# Patient Record
Sex: Male | Born: 1940 | Race: White | Hispanic: No | Marital: Married | Smoking: Never smoker
Health system: Southern US, Community
[De-identification: ages and names within clinical notes are randomized; demographics above are authoritative.]

## PROBLEM LIST (undated history)

## (undated) DIAGNOSIS — C8590 Non-Hodgkin lymphoma, unspecified, unspecified site: Secondary | ICD-10-CM

## (undated) DIAGNOSIS — N400 Enlarged prostate without lower urinary tract symptoms: Secondary | ICD-10-CM

## (undated) DIAGNOSIS — N401 Enlarged prostate with lower urinary tract symptoms: Secondary | ICD-10-CM

## (undated) DIAGNOSIS — N138 Other obstructive and reflux uropathy: Secondary | ICD-10-CM

## (undated) DIAGNOSIS — C8588 Other specified types of non-Hodgkin lymphoma, lymph nodes of multiple sites: Secondary | ICD-10-CM

## (undated) HISTORY — DX: Benign prostatic hyperplasia without lower urinary tract symptoms: N40.0

## (undated) HISTORY — PX: HERNIA REPAIR: SHX51

## (undated) HISTORY — DX: Non-Hodgkin lymphoma, unspecified, unspecified site: C85.90

---

## 2008-07-23 LAB — CBC WITH AUTOMATED DIFF
ABS. EOSINOPHILS: 0 10*3/uL (ref 0.0–0.4)
ABS. LYMPHOCYTES: 0.6 10*3/uL — ABNORMAL LOW (ref 0.8–3.5)
ABS. MONOCYTES: 0.8 10*3/uL (ref 0–1.0)
ABS. NEUTROPHILS: 5.7 10*3/uL (ref 1.8–8.0)
BASOPHILS: 0 % (ref 0–3)
EOSINOPHILS: 0 % (ref 0–5)
HCT: 29.1 % — ABNORMAL LOW (ref 37.0–49.0)
HGB: 9.1 g/dL — ABNORMAL LOW (ref 13.0–16.0)
LYMPHOCYTES: 9 % — ABNORMAL LOW (ref 20–51)
MCH: 23.2 PG — ABNORMAL LOW (ref 25.0–35.0)
MCHC: 31.3 g/dL (ref 31.0–37.0)
MCV: 74.2 FL — ABNORMAL LOW (ref 78.0–98.0)
MONOCYTES: 11 % — ABNORMAL HIGH (ref 2–9)
MPV: 6 FL — ABNORMAL LOW (ref 7.4–10.4)
NEUTROPHILS: 80 % — ABNORMAL HIGH (ref 42–75)
PLATELET: 384 10*3/uL (ref 130–400)
RBC: 3.92 M/uL — ABNORMAL LOW (ref 4.50–5.30)
RDW: 20.7 % — ABNORMAL HIGH (ref 11.5–14.5)
WBC: 7.1 10*3/uL (ref 4.5–13.0)

## 2008-07-24 LAB — GLUCOSE, RANDOM: Glucose: 113 MG/DL — ABNORMAL HIGH (ref 74–99)

## 2008-07-24 LAB — VITAMIN B12: Vitamin B12: 260 pg/mL (ref 211–911)

## 2008-07-24 LAB — HEMOGLOBIN A1C WITH EAG: Hemoglobin A1c: 5.9 % (ref 4.8–6.0)

## 2008-07-25 LAB — C-PEPTIDE
C-Peptide: 1.9 ng/mL (ref 0.8–3.5)
C-Peptide: 1.9 ng/mL (ref 0.8–3.5)

## 2008-07-27 LAB — IRON PROFILE
Iron % saturation: 7 % — ABNORMAL LOW (ref 20–50)
Iron: 12 ug/dL — ABNORMAL LOW (ref 35–150)
TIBC: 168 ug/dL — ABNORMAL LOW (ref 250–450)

## 2008-08-11 DIAGNOSIS — C8290 Follicular lymphoma, unspecified, unspecified site: Secondary | ICD-10-CM | POA: Insufficient documentation

## 2009-03-23 DIAGNOSIS — E041 Nontoxic single thyroid nodule: Secondary | ICD-10-CM | POA: Insufficient documentation

## 2009-03-23 DIAGNOSIS — E559 Vitamin D deficiency, unspecified: Secondary | ICD-10-CM | POA: Insufficient documentation

## 2009-03-23 DIAGNOSIS — G5793 Unspecified mononeuropathy of bilateral lower limbs: Secondary | ICD-10-CM | POA: Insufficient documentation

## 2009-03-23 NOTE — H&P (Signed)
[facility]                              HISTORY AND PHYSICAL                              JUDI Army Melia, M.D.   NAMEBetsey Mcdaniel, [name]              SEX:                M   ADMIT:  03/23/2009                 DOB:                [birth year]   MR#     [id number]                   ROOM:               [id number]   ACCT#   [id number]   cc:    [name], M.D.   PRIMARY CARE PHYSICIAN   None   CHIEF COMPLAINT   Weight loss.   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  The patient states that his symptoms   initiated over 40 years ago while he was in [place].  He believes that he   was infected with a parasite.  He has had indigestion and abdominal   discomfort over the years.  One year ago the patient lost 10 pounds   purposely but experienced some abdominal discomfort and was referred to   radiology to have a CT of the abdomen and pelvis.  That study showed   splenomegaly with significant retroperitoneal adenopathy suspicious for   lymphoma.  There was also a large cyst within the left kidney measuring 10   cm with two smaller cysts.  He also had a fat density measuring 3.2 cm   posterior to the splenic vein and inferior to the pancreas of uncertain   etiology.  The patient was aware of this abnormality but opted not to   pursue Western medicine but to pursue traditional [place] medicine.  He was   seen by a [place] medicine doctor by the name of Luevenia Maxin.  This   person initiated acupuncture cupping and various other techniques which   also included the ingestion of certain teas.  The patient states that his   symptoms were abating, and he felt that he was clinically improving until   about 2 weeks ago.  For reasons he was unable to do divulge to me, he left   her care.  He states at that same time he quit his job and reported that he    retired and has been at home essentially depressed per the patient and his   wife.  He has continued to lose approximately 40 pounds over the last year   and now he presents to the emergency department because of persistent   fatigue and upper and lower extremity swelling.  He has significant scrotal   edema.  He has had some pain in his lower extremities.  He also reports   orthopnea.  Upon arrival to the ED, blood pressure was 142/80, pulse 86 and   respirations  were 16.  He had a low grade fever of 99.1.  O2 saturation was   98% on room air.  The patient was complaining of pain.  Chest x-ray showed   the heart was tortuous.  There was a moderate right pleural effusion left   and a smaller left pleural effusion.  There was also some atelectasis of   the bilateral lower lobes.  A CT of the abdomen showed diffuse   retroperitoneal and mesenteric lymphadenopathy which has progressed since   the previous study of November 2009.  His main consideration would be   lymphoma.  He also has a moderate right pleural effusion as reported   earlier.  He has ascites, diffuse anasarca and mesenteric edema, bilateral   renal cysts, a moderate pericardial effusion as well as patchy sclerotic   foci involving T11 and L3 vertebral bodies, questionable metastatic foci.   The patient's pain was controlled.  His laboratory data was significantly   abnormal as well.  He had hypokalemia and significant anemia with a   hemoglobin and hematocrit of 5 and 17.  The patient was typed and   crossmatched and transfused 2 units of packed red blood cells.  He was   admitted for further evaluation and treatment.   PAST MEDICAL HISTORY   Past medical history is significant for benign prostatic hypertrophy.   ALLERGIES   No known drug allergies.   MEDICATIONS   He takes no medication.   SOCIAL HISTORY   He denies any tobacco, alcohol or illicit drug use.  He is on his second   marriage has two children from a prior marriage.  He is a retired    Sport and exercise psychologist.   PAST SURGICAL HISTORY   Surgical history includes bilateral herniorrhaphies.   REVIEW OF SYSTEMS   As per HPI.  The patient states that he generally does not go to doctors.   He is aware that his symptoms a year ago may have been secondary to   lymphoma but wanted to try alternative medication.  His purpose for   admission is to control his symptoms and to obtain a firm diagnosis.  He is   not sure if he wants to pursue Western medicine, i.e. chemoradiation   therapy.  He reports that he often has headaches.  He denies any blurred   vision.  He has had some shortness of breath.  He reports orthopnea and   PND.  He has not had any chest pain.  He has abdominal discomfort and   suffers from diarrhea and constipation.  He has lower extremity swelling.   He does not have dysuria, urgency or frequency, but the edema in his   extremities has ascended to his penis and scrotum.  He reports some   neuropathy.  He states he also suffers from anxiety disorder and insomnia.   His comprehensive review of systems is negative.   PHYSICAL EXAMINATION   VITAL SIGNS:  His blood pressure is 151/72, pulse 97, respirations 18, and   he is afebrile.   GENERAL:  He is ill in appearance.  He is very, very pale.  He is awake,   alert and oriented x3.   HEENT:  Pupils are equal, round and reactive to light and accommodation.   Extraocular muscles are intact.  He has pale conjunctivae.  He wears   corrective lenses.   NECK:  His neck is supple.  No lymphadenopathy appreciated.  He has a   protuberant Adam's apple.  LUNGS:  He has decreased breath sounds bilaterally.   CARDIOVASCULAR:  Regular rate and rhythm.  No murmurs, clicks or rubs   appreciated.   ABDOMEN:  The abdomen is distended and tender.  No rebound tenderness or   guarding.  He does have a little bit of an umbilical hernia that is   reducible.   EXTREMITIES:  2 to 3+ edema of the bilateral lower extremities.  Upper    extremity edema up into the elbow.   He does have scrotal edema.  Positive   peripheral pulses.   LYMPHATICS:  There is palpable lymphadenopathy in bilateral axillae,   approximately 1-2 cm each, and both are mobile.   LABORATORY DATA   Sodium 137, potassium 3, chloride 97, bicarbonate 34, BUN 11, creatinine   0.5, glucose 128, AST 13, ALT 9, alkaline phosphatase 104, total bilirubin   0.9, calcium 8, albumin 1.8 and lipase is 67.  The CBC shows a white blood   cell count of 4.4, hemoglobin/hematocrit 5.4 and 17.1, MCV 75.6, RDW 22.8,   platelets 143 and 75% segs.  Urinalysis is essentially clear.  Imaging   studies as reported earlier.   IMPRESSION   1. Significant abdominal lymphadenopathy with probable lymphoma diagnosis.   2. 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   3. Severe microcytic anemia.   4. Hypokalemia.   5. Protein calorie malnutrition.   6. Benign prostatic hypertrophy.   PLAN   Will pursue following the diagnosis of the lymphadenopathy and obtain   tissue diagnosis to confirm this.  Consulting surgeon Dr. Tresa Res to assist   with this.  In the interim, will transfuse 2 units of packed red blood   cells.  Will provide diuretic therapy.  Will also obtain PVLs of the   bilateral lower extremities to rule out DVT.  I have also ordered a CT of   the head as the patient's wife reports an episode of mental confusion,   following this with a CT of the chest to follow up the possibility of   metastases to the chest.  Will also obtain a 2-D echocardiogram to rule out   systolic versus diastolic heart failure.  The patient does have bilateral   pleural effusions.  Will also check a variative of labs, iron studies and   stool occult blood, although the patient denies any melena, hematochezia or   hemoptysis.  Will check a PSA level as well as thyroid function studies.   As the patient does have significant abdominal ascites, will obtain a    paracentesis and send ascitic fluid for culture, cytology, albumin, etc.   Deep venous thrombosis and ulcer prophylaxes have also been implemented.   The patient is requesting a do not resuscitate/do not intubate code status.   Thank you for this opportunity to assist in the patient's management.   __________________________   [name], M.D.   Dictated By:   lo  D:  03/23/2009  T:  03/24/2009 12:43 P   [id number]

## 2009-03-23 NOTE — ED Provider Notes (Signed)
ADMISSION                           [facility]                      EMERGENCY DEPARTMENT TREATMENT REPORT   NAME:  [name], [name]                  SEX:            M   DATE:  03/23/2009                     DOB:            [birth year]   MR#    [id number]                       TIME SEEN       11:34 A   ACCT#  [id number]                      ROOM:           [id number]   cc:   TIME OF MY EVALUATION:  10:04.   CHIEF COMPLAINT:  Pain in right side with swelling, also solar plexus area   above bellybutton.   HISTORY OF PRESENT ILLNESS:  This is a 68 year old male who has been ill   for several months.  Back in November 2009 he was diagnosed with lymphoma   and has been following a physician practicing herbal and acupuncture   medicine.  Several weeks ago stopped that relationship.  Believes that he   has a parasite and is trying to find a gastroenterologist.  He was seen by   a physician yesterday, referred to [facility] for admission; however, the   patient does not want to be admitted to [facility].  Comes today for   evaluation requesting GI admission.  He has complaints of being fatigued.   He is sleeping all of the time.  He does have a cough which is productive   of unknown colored secretions, worse at night.  He has had progressive   abdominal pains, mostly mid right upper abdominal area.  It is there daily   and has been going on for months.  Has some dysuria changes and his urine   has become orange and at times has trouble voiding.   REVIEW OF SYSTEMS:   CONSTITUTIONAL:  No fever.  Positive weight loss of about 40 pounds over   the last year.   EYES:  No visual complaints.   ENT: No sore throat, runny nose, or other URI symptoms.   ALLERGIC/IMMUNOLOGIC: No urticaria or allergy symptoms.   RESPIRATORY:  No cough, shortness of breath, or wheezing.   CARDIOVASCULAR:  No chest pain, chest pressure, or palpitations.   GI:  As above.  No melena, hematochezia.   GU:  As above.    MUSCULOSKELETAL:  Has dependent swelling.   INTEGUMENTARY:  No rashes.   NEUROLOGIC:  No headache.   PAST MEDICAL HISTORY:  Lymphoma suspected November 2009.  BPH.   FAMILY HISTORY:  Diabetes.   PAST SURGICAL HISTORY:  Umbilical hernia repair, right inguinal hernia   repair, kidney stone.   SOCIAL HISTORY:  Married.   MEDICATIONS:  None.   ALLERGIES:  None.   PHYSICAL EXAMINATION:   GENERAL:  A very pleasant male.  VITAL SIGNS:  Blood pressure 142/80, pulse 86, respirations 16, temperature   99.1, O2 saturation on room air 98%, pain 8/10.   HEENT:  Eyes:  Pale.  Ears/Nose:  Hearing is grossly intact to voice.   Internal and external examinations of the ears and nose are unremarkable.   Oropharynx:  Somewhat dry membranes.   NECK:  Supple, nontender, symmetrical, no masses or JVD, trachea midline.   Thyroid not enlarged, nodular, or tender.  No cervical or submandibular   lymphadenopathy palpated.   RESPIRATORY:  Clear and equal breath sounds.  No respiratory distress,   tachypnea, or accessory muscle use.   HEART:  Regular rate and rhythm.  Does have pretibial tenderness with 1 to   2+ pitting edema.   ABDOMEN:  Bowel sounds active.  Soft.  He is diffusely tender throughout   his upper abdomen.  No obvious palpable masses or organomegaly.   RECTAL:  Stool brown.  Guaiac negative.   CONTINUATION BY:  TODD Wilfrid Lund, M.D.   ASSESSMENT:  A 68 year old male with abdominal pain, swelling, looks   dehydrated, emaciated.  As an acute illness posing a potential threat to   life or bodily function, this is a high-risk presentation necessitating an   immediate diagnostic evaluation.   DIAGNOSTIC STUDIES:  Stat abdominal pelvic CT showed diffuse   retroperitoneal mesenteric lymphadenopathy which has progressed since   November of 2009.  Main consideration would include lymphoma.  Other   consideration would include diffuse metastatic disease and a moderate right    pleural effusion, small left pleural effusion, moderate pericardial   effusion, ascites, diffuse anasarca, and mesenteric edema.  Patchy   sclerotic fossa involvement of the T11 and L3 vertebral bodies.  Metastatic   fossa cannot be excluded.   Urinalysis was unremarkable.  Hemoglobin and hematocrit of 5.4 and 17.1   respectively.  This was microcytic with an MCV of 75.6.  Of note, patient   is heme-negative from below.  WBC of 4,400.  BUN and creatinine were 11 and   0.5 respectively, glucose of 128.  Liver function test unremarkable.   Albumin was low at 1.8.  Lipase 67.   Chest x-ray showed moderate right pleural effusion, small left pleural   effusion.  Note pulse oximetry was 96% on room air.   EMERGENCY DEPARTMENT COURSE:  The patient was given IV fluids.  This case   was discussed with Dr. [name].  Patient will be admitted for further   treatment and evaluation.  He was typed and crossed for 2 units of packed   RBCs.  Patient will be given a transfusion.   FINAL DIAGNOSES:   1. Acute abdominal pain.   2. Lymphoma with metastases.   3. Acute severe anemia.   DISPOSITION:  Patient admitted to a regular medical bed in stable   condition.   ____________________________   Erlinda Hong, M.D.   Dictated By:  Blanchard Mane, PA   My signature above authenticates this document and my orders, the final   diagnosis(es), discharge prescription(s) and instructions in the [facility] record.   st  D:  03/23/2009  T:  03/25/2009 11:55 P   [id number]   st  D:  03/23/2009  T:  03/27/2009  3:12 A   [id number]

## 2009-03-24 NOTE — Procedures (Signed)
[facility]                              CARDIOLOGY DEPARTMENT                              ECHOCARDIOGRAM REPORT   NAME:  [name], [name]                              SS#:      [id number]   DOB:    1941/07/24                                      AGE:     67   SEX:    M                                           ROOM#:  [id number]   MR#:    [id number]                                       DATE:    03/24/2009   BILLING# [id number]   Referring Physician:  [name], MD   Reason For Study:  Acute abdominal pain.   M-Mode Measurements (Parasternal)                 Measured   Normal   RVDd                                            -- mm   7-23 mm   IVSD                                            13 mm   7-11 mm   LVIDd                                           51 mm   37-54 mm   LVPWd                                           13 mm   7-11 mm   LVIDs                                           32 mm   Aortic Root Diameter                            30 mm   20-37 mm   ACS                                             --  mm            16-26 mm   Left Atrial Diameter                            50 mm   19-40 mm   2-Dimensional and Doppler Findings   Reason For Study: Abdominal pain.   Echocardiogram showed left ventricle normal size.  There is well preserved   to hyperdynamic left ventricular function.  Ejection fraction 67%.  No   significant regional abnormalities seen.  There was LVH with the septum and   posterior wall thickness of 13 mm.  This is associated with an e/a ratio of   0.77, compatible with diastolic dysfunction.  The mitral apparatus was   anatomically normal, though it appeared to be in moderate mitral   insufficiency with a left atrium increased to 5 cm.  The aortic valve   showed no significant stenosis, may represent a bicuspid structure.  There   is no aortic insufficiency.  The right ventricle appeared to be normal    size.  There is significant TR which is in the moderate to severe category.   Estimated right-sided pressure may be as high as 73 mmHg.  Pulmonic valve   appeared to be normal.  There was a small to moderate size pericardial   effusion circumferential in nature.  There is no evidence for any   compromise of diastolic filling of the right ventricle.  The right atrium   was hypermobile especially at the base.  It did however appeared to fill   completely.   OVERALL IMPRESSION   1. Left ventricular hypertrophy with diastolic dysfunction.   2. Significant elevation of right-sided pressure.   3. Small to moderate size circumferential pericardial effusion.   __________________________________________________   Thomasena Edis, M.D.   M-Modes Only Dictated by Mikey Bussing, TECH   md  D: 03/24/2009  T: 03/24/2009  1:59 P    [id number]/   cc:   [name], M.D.         Thomasena Edis, M.D.

## 2009-03-24 NOTE — Procedures (Signed)
[facility]                              CARDIOLOGY DEPARTMENT                              ECHOCARDIOGRAM REPORT   NAME:  [name], [name]                              SS#:      [id number]   DOB:    1941-08-01                                      AGE:     67   SEX:    M                                           ROOM#:  [id number]   MR#:    [id number]                                       DATE:    03/24/2009   BILLING# [id number]   Referring Physician:  [name], MD   Reason For Study:  Acute abdominal pain.   M-Mode Measurements (Parasternal)                 Measured   Normal   RVDd                                            -- mm   7-23 mm   IVSD                                            13 mm   7-11 mm   LVIDd                                           51 mm   37-54 mm   LVPWd                                           13 mm   7-11 mm   LVIDs                                           32 mm   Aortic Root Diameter                            30 mm   20-37 mm   ACS                                             --  mm            16-26 mm   Left Atrial Diameter                            50 mm   19-40 mm   2-Dimensional and Doppler Findings   Reason For Study: Abdominal pain.   Echocardiogram showed left ventricle normal size.  There is well preserved   to hyperdynamic left ventricular function.  Ejection fraction 67%.  No   significant regional abnormalities seen.  There was LVH with the septum and   posterior wall thickness of 13 mm.  This is associated with an e/a ratio of   0.77, compatible with diastolic dysfunction.  The mitral apparatus was   anatomically normal, though it appeared to be in moderate mitral   insufficiency with a left atrium increased to 5 cm.  The aortic valve   showed no significant stenosis, may represent a bicuspid structure.  There   is no aortic insufficiency.  The right ventricle appeared to be normal   size.  There is significant TR which is in the  moderate to severe category.   Estimated right-sided pressure may be as high as 73 mmHg.  Pulmonic valve   appeared to be normal.  There was a small to moderate size pericardial   effusion circumferential in nature.  There is no evidence for any   compromise of diastolic filling of the right ventricle.  The right atrium   was hypermobile especially at the base.  It did however appeared to fill   completely.   OVERALL IMPRESSION   1. Left ventricular hypertrophy with diastolic dysfunction.   2. Significant elevation of right-sided pressure.   3. Small to moderate size circumferential pericardial effusion.   __________________________________________________   Thomasena Edis, M.D.   M-Modes Only Dictated by Mikey Bussing, TECH   md  D: 03/24/2009  T: 03/24/2009  1:59 P    [id number]/   cc:   [name], M.D.         Thomasena Edis, M.D.

## 2009-03-25 NOTE — Consults (Signed)
[facility]                               CONSULTATION REPORT                     CONSULTANT:  Jake Michaelis, M.D.   NAMEBetsey Mcdaniel, [name]                         CONSULT  DATE:       03/25/2009   ADMIT 03/23/2009                            SEX:                 M   DATE:   MR#   [id number]                              DOB:                 02-Feb-1941   ACCT# [id number]                             LOCATION:            [id number]   cc:    [name], M.D.          Jake Michaelis, M.D.   REASON FOR CONSULTATION   Anemia, weight loss and lymphadenopathy, rule out lymphoma.   HISTORY OF PRESENT ILLNESS   The patient is a 68 year old male who came into the ER with severe   generalized weakness, lost a total of 40-50 pounds in the last year.  He   has swelling of both upper and lower extremities and scrotal edema.  He has   pain in the lower extremities and had low grade fevers on and off which are   all below 100 degrees.  He  had a CT scan of the abdomen done and the ER   showing diffuse retroperitoneal and mesenteric lymphadenopathy which has   progressed since November 2009.  He had a right pleural effusion, ascites   and diffuse anasarca, mesenteric edema and moderate pericardial effusion.   He had a hemoglobin of 5 and hematocrit of 17 and was transfused 2 units of   packed RBC on admission.  He also had a paracentesis done of the ascitic   fluid yesterday.   PAST MEDICAL HISTORY   Benign prostatic hypertrophy.   PAST SURGICAL HISTORY   None.   ALLERGIES   No known drug allergies.   MEDICATIONS   None prior to being admitted.   REVIEW OF SYSTEMS:   GENERAL:  Very fatigued, weight loss, no appetite.  Low grade fevers   present but no night sweats.   HEENT:  No complaints of headaches, vision changes or dizziness.   CARDIAC:  No complaints of chest pain or palpitations.   RESPIRATORY:  No complaints of coughing, hemoptysis or wheezing.  He does    have shortness of breath.   GASTROINTESTINAL:  No complaints of nausea, vomiting, constipation,   diarrhea, hematemesis, melena or hematochezia or abdominal pain.   CNS:  No complaints of focal weakness, numbness or tingling.   EXTREMITIES:  Swelling of both lower extremities and scrotal edema.  MUSCULOSKELETAL:  No complaints of specific bone pain or lumps.   GENITOURINARY:   No complaints of urinary burning, hematuria or frequency.   PSYCHIATRIC:  No complaints of mood changes, depression or anxiety.   SKIN:  No new rash.   GENERAL:  Moderately built, moderately nourished, not in any acute   distress.   VITAL SIGNS:   Blood pressure 130/70 mmHg, pulse rate 90 per minute   afebrile.   HEENT:  Normocephalic and atraumatic.  Pupils are round and reacting to   light bilaterally.  Oral mucosa is moist.  No ulcer or thrush seen.   Bilateral axillary lymphadenopathy palpable 1-2 cm in size, bilateral   inguinal lymphadenopathy palpable.   NECK:  Supple.   LYMPH NODES:  No axillary, cervical or inguinal lymphadenopathy palpable.   LUNGS:  Bilateral clear breath sounds heard on auscultation.  No wheezes or   crepitations heard.   HEART:  S1, S2 heard.  Regular rate and rhythm.  No murmurs gallops or   rubs.   ABDOMEN:   Soft and nontender.  No hepatosplenomegaly palpable.  Bowel   sounds are normoactive.   MUSCULOSKELETAL:  No spinal tenderness.  Has good range of motion in all   extremities.  1-2 cm with edema bilateral lower extremities.   SKIN:  No rash seen.  No clubbing or cyanosis.   NEUROLOGIC:  Alert and oriented to time, place and person.  Cranial nerves   are intact.  Motor and sensory are grossly normal.  Reflexes are 2+ and   symmetrical.   PSYCHIATRIC:  Mood and affect are appropriate.   LABORATORY   WBC of 4.4 on admission, hemoglobin 5.4, platelets 143.  CMP is normal.   ASSESSMENT/PLAN   A 68 year old male with significant weight loss, anemia and abdominal    lymphadenopathy with axillary and inguinal lymphadenopathy.  Lymphoma needs   to be ruled out, a lymph node biopsy is being planned by Dr. Tresa Res.  A   Doppler of both lower extremities did not reveal any deep venous   thrombosis.  Echocardiogram revealed left ventricular hypertrophy with   diastolic dysfunction and an ejection fraction of 67%.  CBC today showed   hemoglobin of 8.1, status post 2 units of packed RBC.  Once the lymph node   biopsy results are available it does show lymphoma.  I will discuss with   the patient the treatment options.  I will check a uric acid and send   peripheral blood for flow cytometry and serum electrophoresis to try to   complete the workup.  He would need a bone marrow aspiration and biopsy if   it does show lymphoma or if the lymph node is nondiagnostic.  I do   appreciate consulting me on this very pleasant patient and will keep you   updated.   ____________________________   Jake Michaelis, M.D.   Dictated ByDavonna Belling  D:  03/25/2009  T:  03/29/2009  2:27 P   [id number]

## 2009-03-28 NOTE — Consults (Signed)
[facility]                               CONSULTATION REPORT                        CONSULTANT:  Rayburn Felt, M.D.   NAMEBetsey Mcdaniel, [name]                         CONSULT  DATE:       03/28/2009   ADMIT 03/23/2009                            SEX:                 M   DATE:   MR#   [id number]                              DOB:                 1941/04/15   ACCT# [id number]                             LOCATION:            [id number]   cc:    Rayburn Felt, M.D.          [name], M.D.   REFERRING PHYSICIAN   [name], MD   CHIEF COMPLAINT   The patient is 68 year old white male with weight loss, anemia,   splenomegaly and intra-abdominal lymphadenopathy whom I am asked to   evaluate.   HISTORY OF PRESENT ILLNESS   The patient tells me that for the last 2-3 years he has been slowly losing   weight despite eating a reasonable diet.  We do have a CT scan of the   abdomen from 08/12/2008 which revealed mild splenomegaly and   intra-abdominal adenopathy.  He chose not to pursue workup at that time and   was taking some form of alternative medicine.  Over the past several   months, he has had continued weight loss with increasing fatigue and   weakness.  It is not clear whether he has had periods of confusion, but   that may have been another aspect of his symptoms.  He is now admitted and   found to have a hemoglobin of 5, MCV of 75 with cachexia and ascites on   examination.  CT scan of the abdomen shows mild splenomegaly with ascites   and small pleural effusions and also intra-abdominal adenopathy.  He   received packed red blood cell transfusions and underwent paracentesis.  No   malignant cells were found.  Flow cytometry at the peripheral blood failed   to show any clonal abnormalities.  He is scheduled to undergo   CT-scan-directed biopsy of intra-abdominal lymphadenopathy.   At the present time, the patient is intermittently confused and sometimes    cannot give simple answers such as his telephone number.  He is otherwise   awake and alert.  He denies fevers, chills, sweats, headache, blurred   vision, dizziness, syncope, seizures, chest pain, shortness of breath,   cough, hemoptysis, bone pain, back pain, nausea, vomiting, abdominal pain,   constipation, diarrhea, melena, bright red  blood per rectum, dysuria,   pyuria, hematuria, flank pain, bruising or bleeding.   PAST MEDICAL HISTORY   Significant for benign prostatic hypertrophy.   MEDICATIONS   He was on no medications prior to admission.   ALLERGIES   No known drug allergies.   SOCIAL HISTORY   He is married and lives in [place] with his wife.  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  He does not smoke cigarettes or drink alcohol.   FAMILY HISTORY   Noncontributory.   PHYSICAL EXAMINATION   GENERAL:  Chronically ill-appearing white male in no acute distress.   VITAL SIGNS:  Afebrile, pulse 101, respiratory rate 28, blood pressure   151/87.   HEENT:  Normocephalic and atraumatic.  Extraocular movements are intact.   Conjunctivae are pale.  Sclerae anicteric.  Mouth is without lesions.   NECK:  Supple without thyromegaly.  There is no lymphadenopathy.   LUNGS:  Decreased breath sounds at the bases.   BACK:  The bones in back are without tenderness.   ABDOMEN:  Soft and slightly distended without hepatosplenomegaly or masses.   EXTREMITIES:  Without clubbing, cyanosis or edema.   SKIN:  Without rash, petechiae or ecchymoses.   Sodium 139, potassium 3.8, chloride 101, bicarbonate 31, glucose 104, BUN   13, creatinine 0.6.  White count 5.2, hemoglobin 8.3, hematocrit 26.0, MCV   81 and platelets 108,000, calcium 8.1, magnesium 1.8, ammonia 30.  Labs   from admission also showed normal liver function tests and LDH of 120.   Uric acid 2.3.  Hemoglobin 5.4, hematocrit 17.1, MCV 76 and platelets   143,000.  The reticulocyte count is 3.35.  The flow cytometry was negative    as stated.  The urinalysis showed small bilirubin and 4 urobilinogen, was   otherwise negative.  The INR 1.4, TSH 3.57, free T4 1.39, iron 16, TIBC   131, ferritin 540 and S-PEP was negative.  The beta-2 microglobulin is   elevated at 3.48.  PSA 1.43.  HIV was negative as was stool for occult   blood.  The CT scan of the head showed no pathology.  CT scan of chest,   abdomen and pelvis showed large bilateral pleural effusions and compressive   atelectasis with mild airspace disease.  There were multiple low   attenuation lesions in the liver which were unchanged from 08/12/2008.   There is retroperitoneal adenopathy with the largest mass measuring 7.1 x   4.1 cm.  The echocardiogram showed left ventricular hypertrophy with   diastolic dysfunction and elevated right-sided pressures.  There was a   small to moderate sized circumferential pericardial effusion.  The PVLs are   negative.   IMPRESSION   The patient is a 68 year old white male who presents with increasing weight   loss and was found to have ascites, anemia, mild splenomegaly and   intra-abdominal adenopathy that is increased compared with scans from 7   months ago.  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  We agree with the need   for histologic diagnosis, and it is reasonable to start with the safest   procedure such as a CT-scan guided biopsy.  On many occasions, we are   unable to get a diagnosis, so it may be necessary to do an open biopsy.   Because of the elevated prothrombin time, which is most likely due to   vitamin K deficiency,  I will give vitamin K 10 mg subcutaneous.  The anemia   appears to be due to chronic disease, but I must check a haptoglobin to   rule out hemolysis.  I will also check B12 and folate levels.  I will also   check a vitamin D level because of his poor nutritional status.   He does have some degree of confusion that is probably not acute.  Patients    with lymphoma scan develop cryptococcal meningitis, so I will check a serum   cryptococcal antigen and IgG level to be sure he is not   hypogammaglobulinemic.  We can make further recommendations as additional   results are obtained.   I appreciate the opportunity to evaluate this patient.  If there are any   questions, please feel free to contact me.   ____________________________   Rayburn Felt, M.D.   Dictated By:   cd  D:  03/28/2009  T:  03/29/2009  4:02 P   [id number]

## 2009-03-30 NOTE — Consults (Signed)
[facility]                               CONSULTATION REPORT                       CONSULTANT:  Luna Kitchens, M.D.   NAMEBetsey Mcdaniel, [name]                         CONSULT  DATE:       03/30/2009   ADMIT 03/23/2009                            SEX:                 M   DATE:   MR#   [id number]                              DOB:                 01-09-1941   ACCT# [id number]                             LOCATION:            [id number]   cc:    Luna Kitchens, M.D.          [name], M.D.   REASON FOR CONSULTATION   Altered mental status.   HISTORY OF PRESENT ILLNESS   This is a 68 year old gentleman who was admitted with weight loss, anemia,   splenomegaly, as well as intra- abdominal lymphadenopathy with suspicion   for lymphoma.   No family members are available for a good history, and the history is   taken from his medical records.   Over the last 2 to 3 years, he has been slowly losing weight, and according   to his chart, he lost between 40 and 50 pounds.   He is very slow to answer any questions, but he denies any headache, right   or left-sided weakness, numbness or any seizure activity.   REVIEW OF SYSTEMS   He denies any fever, chills, dizziness or any chest pain, palpitation or   difficulty breathing.   PAST MEDICAL HISTORY   Significant for benign prostatic hypertrophy.   MEDICATIONS   On admission, he was not on any medication.   SOCIAL HISTORY   He denies any alcohol or any tobacco.   PHYSICAL EXAMINATION   On examination, he is a very cachectic gentleman, very slow to understand   and to answer simple questions.  He also has difficulty following one   simple verbal command.   NECK:  Supple.  No neck rigidity or any stiffness.   HEART:  S1-S2 normal without any underlying murmur.   LUNGS:  Clear without any wheezing or rales.  There is good air entry   bilaterally.   LOWER EXTREMITIES:  No pitting edema, varicose veins or any swollen   joints.    NEUROLOGIC: He is awake.  He has difficulty following one simple verbal   command.  No clear dysphagia or dysarthria.   CRANIAL NERVE EXAMINATION:  Pupils are equal and reactive bilaterally.  Eye   movement in all directions.  No nystagmus.  Visual field grossly intact by   confrontation.  Face is symmetrical.  Facial sensation is intact for pin   touch.  Tongue is midline.  Uvula is midline.   MOTOR EXAMINATION:  He is moving all extremities against gravity.   DEEP TENDON REFLEXES:  Deep tendon reflexes are 1+ symmetrical.  Plantars -   Toes are downgoing bilaterally.   SENSORY EXAMINATION:  Grossly intact for pin touch to the right or left   side of his body.   COORDINATION:  Finger-to-nose test intact bilaterally without dysmetria.   No right or left confusion.   GAIT:  Not tested.   ASSESSMENT   Altered mental status.  Could be secondary to metabolic encephalopathy.   RECOMMENDATIONS   Will schedule him for an MRI of his head to rule out any underlying   intracranial pathological lesion as well as an EEG, a lumbar tap, and will   get an ammonia level today.   I will follow him up during his admission to the hospital.   Thank you for letting me participate in the care of your patient.   ____________________________   Luna Kitchens, M.D.   Dictated By:   lo  D:  03/30/2009  T:  03/31/2009  8:44 A   [id number]

## 2009-03-31 NOTE — Procedures (Signed)
[facility]                           ELECTROENCEPHALOGRAM REPORT   NAME:     [name], [name]                              DATE:      03/31/2009   EEG#:                                                 MR#:       [id number]   BILLING# [id number]                                    DOB:   06/02/1941   ROOM:     [id number]                                   AGE:       67   REFERRING PHYSICIAN:    JUDI D. GRIFFIN, M.D.   cc:   NADER G. ATALLA, M.D.         JUDI D. GRIFFIN, M.D.   CLINICAL HISTORY   This is a 67-year-old gentleman who was admitted with retroperitoneal   lymphadenopathy.  He has been lethargic for more than a couple of days.  He   is currently on spironolactone, Dilaudid as well as Lorazepam.   This is a 16-channel  EEG performed on a 67-year-old male who is described   as lethargic during the recording.  The recording is poorly disorganized   without clear anterior to posterior gradient.   The background is characterized by diffuse delta and theta frequency over   both hemispheres sometimes superimposed by muscle artifact and electrode   artifact, no epileptiform discharge.   Hyperventilation was not obtained.   Photic stimulation does not change the record.   EKG showed normal sinus rhythm at 72/minute.   EEG CLASSIFICATION   Abnormal.   CLINICAL INTERPRETATION   This is an abnormal EEG due to the findings described above.  It is   indicative of diffuse cerebral dysfunction.  No epileptiform discharge.   ____________________________________   ___________________________   NADER G. ATALLA, M.D.                    Date   rew  D: 03/31/2009  T: 03/31/2009 10:21 P   [id number]

## 2009-03-31 NOTE — Procedures (Signed)
[facility]                           ELECTROENCEPHALOGRAM REPORT   NAME:     [name], [name]                              DATE:      03/31/2009   EEG#:                                                 MR#:       [id number]   BILLING# [id number]                                    DOB:   03/28/1941   ROOM:     [id number]                                   AGE:       67   REFERRING PHYSICIAN:    [name], M.D.   cc:   Luna Kitchens, M.D.         [name], M.D.   CLINICAL HISTORY   This is a 68 year old gentleman who was admitted with retroperitoneal   lymphadenopathy.  He has been lethargic for more than a couple of days.  He   is currently on spironolactone, Dilaudid as well as Lorazepam.   This is a 16-channel  EEG performed on a 68 year old male who is described   as lethargic during the recording.  The recording is poorly disorganized   without clear anterior to posterior gradient.   The background is characterized by diffuse delta and theta frequency over   both hemispheres sometimes superimposed by muscle artifact and electrode   artifact, no epileptiform discharge.   Hyperventilation was not obtained.   Photic stimulation does not change the record.   EKG showed normal sinus rhythm at 72/minute.   EEG CLASSIFICATION   Abnormal.   CLINICAL INTERPRETATION   This is an abnormal EEG due to the findings described above.  It is   indicative of diffuse cerebral dysfunction.  No epileptiform discharge.   ____________________________________   ___________________________   Luna Kitchens, M.D.                    Date   rew  D: 03/31/2009  T: 03/31/2009 10:21 P   [id number]

## 2009-04-04 DIAGNOSIS — D497 Neoplasm of unspecified behavior of endocrine glands and other parts of nervous system: Secondary | ICD-10-CM | POA: Insufficient documentation

## 2009-04-04 DIAGNOSIS — C8590 Non-Hodgkin lymphoma, unspecified, unspecified site: Secondary | ICD-10-CM | POA: Insufficient documentation

## 2009-04-04 DIAGNOSIS — K219 Gastro-esophageal reflux disease without esophagitis: Secondary | ICD-10-CM | POA: Insufficient documentation

## 2009-04-04 DIAGNOSIS — I517 Cardiomegaly: Secondary | ICD-10-CM | POA: Insufficient documentation

## 2009-04-04 DIAGNOSIS — N401 Enlarged prostate with lower urinary tract symptoms: Secondary | ICD-10-CM | POA: Insufficient documentation

## 2009-04-04 NOTE — Discharge Summary (Signed)
[facility]                                TRANSFER SUMMARY                           [name], M.D.   NAMEBetsey Mcdaniel, [name]                     ADMIT DATE:       03/23/2009   MR#    [id number]                          [name] DATE:       04/04/2009   ACCT#  [id number]                         SEX:              [name], M.D.                DOB:              1941-05-07   cc:    [name], M.D.          [name], M.D.   DISCHARGE DIAGNOSES   1. Follicular B-cell lymphoma, status post chemotherapy.   2. Anemia due to #1, status post transfusion of 2 units of packed blood      cells.   3. Moderate protein calorie malnutrition due to #1 improving.   4. Confusion of unknown etiology, improving followed by neurology service.   5. Urinary retention.  Will need voiding trial as outpatient with the      urology service.   6. Hypokalemia, improved, due to poor nutrition.   CONSULTATIONS   Dr. Madelyn Flavors from neurology service.   Dr. Arnette Felts surgery is not on the will have from hematology oncology   service.   Dr. Sunnie Nielsen from hematology-oncology service.   PROCEDURES TEST DONE DURING HOSPITAL STAY AS FOLLOWS   1. Chest x-ray - small bilateral pleural effusions unchanged.   2. Ultrasound guided paracentesis of the ascitic fluid.   3. CT of the head showed marked atrophy, no hemorrhage.   4. CT of the neck showed no adenopathy.   5. CT of the chest showed motion artifact, new bilateral pleural effusions      and compressive atelectasis stable large mixed density of the left      thyroid lobe mass.  Multiple low-attenuation lesions about the liver,      intra-abdominal ascites.  Retroperitoneal adenopathy.  Bilateral renal      cysts.  Needle biopsy CT guided of the lymph node was also performed.      This was of a retroperitoneal mass.   6. MRI of the brain - chronic microvascular ischemic changes and atrophy,      otherwise no disease identified.    7. EEG - diffuse cerebral dysfunction.  No epileptiform discharges were      found.   8. Lower extremity venous Doppler is negative for thrombosis.   9. 2-D echocardiogram showed left ventricular hypertrophy with diastolic      dysfunction, significant elevation of right-sided pressure, small to      moderate-sized circumferential pericardial effusion.   SIGNIFICANT LABORATORY STUDIES DONE DURING THE HOSPITAL STAY  Low potassium 3.0 when the patient presented; upon discharge it is 4.1.   Ammonia has been normal 30 and less than 25.  The patient's hemoglobin when   he presented was 5.4; it is now 9.6.  Platelet count has been stable.   Urinalysis unremarkable.  INR has been mildly elevated 1.3.  Free T4, TSH   and intact PTH has been all normal.  B12 folate was normal.  TIBC low 131   and iron was 16.  Protein electrophoresis did not show an M spike.   Her vitamin D level was low 15, normal being between 30 and 100.  PSA was   normal 1.43.  Ascitic fluid was unremarkable for infection.  HIV test was   negative.  Tissue diagnosis of the retroperitoneal lymph node showed   features immunoperoxidase stains consistent with low grade follicular   lymphoma.  Ascitic fluid showed mesothelial cells, lymphocytes and blood   cellular elements.  No malignant cells were identified.   PRESENTATION HISTORY AND HOSPITAL COURSE   Please refer to Dr. Jone Baseman dictation for further details regarding the   patient's presentation.  Briefly patient is a 68 year old male who   presented to the hospital with weight loss.  Apparently he has had this   notion of lymphadenopathy but he chose to pursue [place] medicine.   Subsequently, he did not really feel better and presented to the emergency   department because of this weakness.  He was found to have very low   hemoglobin.  He had a CT scan demonstrating lymphadenopathy in the abdomen   with probable diagnosis of lymphoma.  He then proceeded to have biopsy of    this lymphatic mass, which showed the above findings consistent with   follicular lymphoma.  Oncology service was involved in his care and gave   him appropriate chemotherapy.  He tolerated this well.  Also during his   hospital stay he was given a blood transfusion.  He was evaluated by   neurology service because he appeared to be somewhat confused.  His imaging   studies demonstrated atrophy but insult  His mental status somewhat   improved but will need followup.  He also was quite malnourished and   nutritional service was following with him.  He had hypokalemia and this   was replaced.  Other than above he was found to have significant urinary   retention.  Foley catheter had to be inserted.  This was then removed while   the patient was on Flomax but he could still not avoid.  As of during the   last 24 hours the patient has retained 400, 500 and 700 mL of urine   postvoid and a Foley catheter had to be reinserted.  He is being continued   on Flomax.  I did call urology service, Dr. Geni Bers who advised to   follow up with the patient as outpatient in 1 week.   Overall today the patient feels well.  His lungs are clear.  Heart is   regular.  Abdomen is nontender.  Extremities show no edema.  He is alert,   oriented x3 and is stable for discharge.  However, he will need to go to   the nursing home for his rehab needs.   FOLLOW-UP INSTRUCTIONS   The patient should follow up with Dr. Henrietta Hoover office in 1 week for voiding   trial.  Phone number there is [phone].  The patient should follow up with  Dr. Chestine Spore from hematology-oncology service in 1 week for his continued   chemotherapy needs as well as to follow up on CBC.  I called Dr.   Merlinda Frederick, his partner, who informed me that his staff will call the   patient at the nursing home to inform him about this coming appointment.   The patient should follow up with neurology Dr. Doreene Adas office in 2 weeks    to follow up on his confusion.  It is quite possible that this gentleman is   developing dementia based on his CT scan findings.  Please keep Foley in   place until evaluated by urology service.   DISCHARGE MEDICATION CHANGES   The patient was not on any medications when he presented now he will be on   1. Ambien 5 mg p.o. q.h.s. p.r.n.   2. Flomax 0.4 mg p.o. q.h.s.   3. Vitamin D 400 units p.o. daily.   4. Allopurinol 300 mg p.o. daily as initiated by hematology and oncology      service.   Total time needed for arrangement of the patient's discharge including   making arrangements with consulting services for his followup, discussing   plan of care with the patient, the patient's wife, Child psychotherapist, nursing   staff, etc. took about 40 minutes.   Do not type on or below this line   ____________________________   [name], M.D.   Dictated By:   md  D:  04/04/2009  T:  04/04/2009  1:45 P   [id number]

## 2009-06-21 DIAGNOSIS — N401 Enlarged prostate with lower urinary tract symptoms: Secondary | ICD-10-CM | POA: Insufficient documentation

## 2009-06-21 DIAGNOSIS — R339 Retention of urine, unspecified: Secondary | ICD-10-CM | POA: Insufficient documentation

## 2009-06-23 DIAGNOSIS — R69 Illness, unspecified: Secondary | ICD-10-CM | POA: Insufficient documentation

## 2009-06-28 DIAGNOSIS — D801 Nonfamilial hypogammaglobulinemia: Secondary | ICD-10-CM | POA: Insufficient documentation

## 2009-08-14 DIAGNOSIS — L408 Other psoriasis: Secondary | ICD-10-CM | POA: Insufficient documentation

## 2010-01-09 LAB — PSA SCREENING (SCREENING): Prostate Specific Ag: 4.8

## 2010-03-14 LAB — PSA SCREENING (SCREENING): Prostate Specific Ag: 3.1

## 2010-05-19 NOTE — Procedures (Signed)
Test Reason : Pre/Op Cardiovascular Exam   Blood Pressure : ***/*** mmHG   Vent. Rate : 063 BPM     Atrial Rate : 063 BPM      P-R Int : 176 ms          QRS Dur : 080 ms       QT Int : 390 ms       P-R-T Axes : 022 036 014 degrees      QTc Int : 399 ms   Normal sinus rhythm   RSR' or QR pattern in V1 suggests right ventricular conduction delay   Borderline ECG   No previous ECGs available   Confirmed by Gotti, M.D., Maruthi ([id number]) on 05/19/2010 1:12:38 PM   Referred By:             Overread By: Maruthi Gotti, M.D.

## 2010-05-19 NOTE — Procedures (Signed)
Test Reason : Pre/Op Cardiovascular Exam   Blood Pressure : ***/*** mmHG   Vent. Rate : 063 BPM     Atrial Rate : 063 BPM      P-R Int : 176 ms          QRS Dur : 080 ms       QT Int : 390 ms       P-R-T Axes : 022 036 014 degrees      QTc Int : 399 ms   Normal sinus rhythm   RSR' or QR pattern in V1 suggests right ventricular conduction delay   Borderline ECG   No previous ECGs available   Confirmed by Tory Emerald, M.D., Maruthi ([id number]) on 05/19/2010 1:12:38 PM   Referred By:             Overread By: Donella Stade, M.D.

## 2010-08-04 NOTE — Op Note (Signed)
[facility]   Operation Report   NAME:  Mcdaniel, Shawn   SEX:   M   DATE: 08/04/2010   DOB: 08/22/1941   MR#    [id number]   ROOM:     ACCT#  [id number]       cc: Marta Rivera MD       PREOPERATIVE DIAGNOSIS:   Benign prostatic hyperplasia.       POSTOPERATIVE DIAGNOSIS:   Benign prostatic hyperplasia.       PROCEDURE:   1.  Cystoscopy.   2.  GreenLight photovaporization of the prostate.       SURGEON:   Dr. John Malcolm       ASSISTANT:   Jack Zuckerman       ANESTHESIA:   General.       BLOOD LOSS:   3 cc       DRAINS:   A 20-French Foley catheter to gravity.       SPECIMENS:   None.       COMPLICATIONS:   None.       INDICATIONS FOR THE PROCEDURE   The patient is a 69-year-old male who presented with urinary retention.  He    has somewhat of a complicated urologic course, in that he has some obstructive    symptoms.  However, his urodynamics were not entirely conclusive for outlet    obstruction as a cause for his retention.  Despite this, he desired to proceed    with GreenLight laser photovaporization of the prostate and presents today    for that.       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  Ureteral orifices    were orthotopic.  After the PVP (photoselective vaporization of the prostate),    the prostatic urethra was widely patent.       PROCEDURE IN DETAIL:   The patient was brought back to the operating room, placed on the table in the    supine position.  He received a dose of cefazolin and gentamicin    preoperatively for prophylaxis.  He was then placed under general LMA    anesthesia, and then repositioned in the lithotomy position.  A surgical    timeout was performed, and all were in agreement.  The 23-French GreenLight    cystoscope was then introduced transurethrally.  The prostatic urethra was    significant for trilobar hyperplasia with obstruction.  There was also a small     median lobe.  The ureteral orifices were orthotopic and were well away from    the median lobe.  The bladder was significant for some mild to moderate    trabeculation.  There was minimal cellules.  There was no diverticulum.  There    were no tumors identified.  We then passed the GreenLight laser fiber through    the scope and initially starting on 60 watts we began to vaporize the    prostate, initially at the median lobe and then on both of the lateral lobes.     We then increased the power up to a maximum of 120 watts.  The total pedal    time of the procedure was 22 minutes and 4 seconds.  The total joules was    133,888.  The limits of our resection were the bladder neck proximally and    verumontanum distally.  We were careful to avoid the   ureteral orifices.  They    were uninvolved both before and after our resection.  When we had completely    vaporized all of the prostate adenoma there was adequate hemostasis.  The    margins again were negative, and the verumontanum and the orifices were    uninvolved.  The urethra was widely patent.  We then withdrew the cystoscope.     A 20-French Foley catheter was placed transurethrally without difficulty, 30    cc was instilled into the balloon.  The urine was clear.  This was placed to    gravity drainage, and at that point the case was ended.  The patient was    awoken from anesthesia, transferred to the post anesthesia care unit in stable    condition.       PLAN:   The patient will be recovered in the post anesthesia care unit and discharged    home today.  He will keep the Foley catheter overnight and it will be removed    in the morning, assuming his urine is clear at that time.  He will follow up    with Dr. Malcolm in the urology office on a routine basis.         Jack Zuckerman dictating for John Malcolm, MD           ___________________   John B Malcolm M.D.   Dictated By:.    hp   D:08/04/2010   T: 08/04/2010 16:46:47   [id number]

## 2010-08-04 NOTE — Op Note (Signed)
[facility]   Operation Report   NAME:  [name], [name]   SEX:   M   DATE: 08/04/2010   DOB: November 09, 1940   MR#    [id number]   ROOM:     ACCT#  [id number]       cc: Jon Billings MD       PREOPERATIVE DIAGNOSIS:   Benign prostatic hyperplasia.       POSTOPERATIVE DIAGNOSIS:   Benign prostatic hyperplasia.       PROCEDURE:   1.  Cystoscopy.   2.  GreenLight photovaporization of the prostate.       SURGEON:   Dr. Alita Chyle       ASSISTANT:   Judd Gaudier       ANESTHESIA:   General.       BLOOD LOSS:   3 cc       DRAINS:   A 20-French Foley catheter to gravity.       SPECIMENS:   None.       COMPLICATIONS:   None.       INDICATIONS FOR THE PROCEDURE   The patient is a 69 year old male who presented with urinary retention.  He    has somewhat of a complicated urologic course, in that he has some obstructive    symptoms.  However, his urodynamics were not entirely conclusive for outlet    obstruction as a cause for his retention.  Despite this, he desired to proceed    with GreenLight laser photovaporization of the prostate and presents today    for that.       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  Ureteral orifices    were orthotopic.  After the PVP (photoselective vaporization of the prostate),    the prostatic urethra was widely patent.       PROCEDURE IN DETAIL:   The patient was brought back to the operating room, placed on the table in the    supine position.  He received a dose of cefazolin and gentamicin    preoperatively for prophylaxis.  He was then placed under general LMA    anesthesia, and then repositioned in the lithotomy position.  A surgical    timeout was performed, and all were in agreement.  The 23-French GreenLight    cystoscope was then introduced transurethrally.  The prostatic urethra was    significant for trilobar hyperplasia with obstruction.  There was also a small     median lobe.  The ureteral orifices were orthotopic and were well away from    the median lobe.  The bladder was significant for some mild to moderate    trabeculation.  There was minimal cellules.  There was no diverticulum.  There    were no tumors identified.  We then passed the GreenLight laser fiber through    the scope and initially starting on 60 watts we began to vaporize the    prostate, initially at the median lobe and then on both of the lateral lobes.     We then increased the power up to a maximum of 120 watts.  The total pedal    time of the procedure was 22 minutes and 4 seconds.  The total joules was    133,888.  The limits of our resection were the bladder neck proximally and    verumontanum distally.  We were careful to avoid the  ureteral orifices.  They    were uninvolved both before and after our resection.  When we had completely    vaporized all of the prostate adenoma there was adequate hemostasis.  The    margins again were negative, and the verumontanum and the orifices were    uninvolved.  The urethra was widely patent.  We then withdrew the cystoscope.     A 20-French Foley catheter was placed transurethrally without difficulty, 30    cc was instilled into the balloon.  The urine was clear.  This was placed to    gravity drainage, and at that point the case was ended.  The patient was    awoken from anesthesia, transferred to the post anesthesia care unit in stable    condition.       PLAN:   The patient will be recovered in the post anesthesia care unit and discharged    home today.  He will keep the Foley catheter overnight and it will be removed    in the morning, assuming his urine is clear at that time.  He will follow up    with Dr. Judie Petit in the urology office on a routine basis.         Judd Gaudier dictating for Alita Chyle, MD           ___________________   Ruben Reason M.D.   Dictated By:.    hp   D:08/04/2010   T: 08/04/2010 16:46:47   [id number]

## 2010-12-14 LAB — AMB POC URINALYSIS DIP STICK AUTO W/O MICRO
Glucose (UA POC): NEGATIVE
Ketones (UA POC): NEGATIVE
Nitrites (UA POC): NEGATIVE
Protein (UA POC): 30 mg/dL
Specific gravity (UA POC): 1.02 (ref 1.001–1.035)
Urobilinogen (UA POC): 1
pH (UA POC): 6.5 (ref 4.6–8.0)

## 2010-12-14 LAB — AMB POC PVR, MEAS,POST-VOID RES,US,NON-IMAGING: PVR: 128 cc

## 2010-12-14 NOTE — Progress Notes (Signed)
ASSESSMENT:     1. BPH s/p greenlight PVP 08/2010  2. ED -- does not desire meds    PLAN:     F/U 6 months for PSA/DRE as well as BPH follow up.  Sooner if problems.      SUBJECTIVE:     CC: BPH    [name] is a 70 y.o. male  who presents for follow up.  Had PVP 08/2010.  Was cathing pre-op.  Post op he was able to get off CIC, but still had AUA SS of 30.  Felt sx improved.      Since last visit 3 months ago he has continued to improve.  He has less dysuria, nocturia, daytime frequency since last visit.  He is overall pleased with his urination.  Does not desire any meds to augment the LUTS.  PVR 128 today, was 188 3 months ago.      Still c/o ED, but does not want anything done at this point.      AUA Assessment Score:  AUA Score: 16 ;    AUA Bother Rating: Pleased     Past Medical History   Diagnosis Date   ??? Non Hodgkin's lymphoma    ??? BPH w urinary obs/LUTS    ??? Elevated prostate specific antigen (PSA)    ??? Retention of urine, unspecified      Past Surgical History   Procedure Date   ??? Hx other surgical 1978-1979   ??? Hx hernia repair 1980s   ??? Laser vaporization surgery prostate, complete 08/2010     Current Outpatient Prescriptions   Medication Sig   ??? acetaminophen (TYLENOL) 325 mg tablet Take  by mouth every four (4) hours as needed.        No Known Allergies  History     Social History   ??? Marital Status: Unknown     Spouse Name: N/A     Number of Children: N/A   ??? Years of Education: N/A     Occupational History   ??? Not on file.     Social History Main Topics   ??? Smoking status: Never Smoker    ??? Smokeless tobacco: Not on file   ??? Alcohol Use: No   ??? Drug Use:    ??? Sexually Active:      Other Topics Concern   ??? Not on file     Social History Narrative   ??? No narrative on file        Review of Systems:   Constitutional: No chills, fatigue, fever, hot flashes, malaise, night sweats, sleep disturbance, weight gain or weight loss    Cardiovascular: No chest pain, dyspnea on exertion, edema, irregular heartbeat, loss of consciousness, murmur, orthopnea, palpitations, paroxysmal nocturnal dyspnea, rapid heart rate or shortness of breath   GU: per HPI  Neurologic: No TIA or stroke symptoms     OBJECTIVE:     PHYSICAL EXAM:  Filed Vitals:    12/14/10 1323   BP: 124/78   Height: 5\' 10"  (1.778 m)   Weight: 186 lb (84.369 kg)      Body mass index is 26.69 kg/(m^2).   General: alert, oriented to person, place and time, no acute distress and no anxiety, depression or agitation  Chest: inspection normal - no chest wall deformities or tenderness, respiratory effort normal  Extremities: extremities normal, atraumatic, no cyanosis or edema  Neuro: alert, oriented x3     Review of Labs, Medical Images, tracings or specimens:  Results for orders placed in visit on 12/14/10   AMB POC URINALYSIS DIP STICK AUTO W/O MICRO       Component Value Range    Color Yellow  (none)     Clarity Clear  (none)     Glucose Negative  (none)     Bilirubin 1+  (none)     Ketones Negative  (none)     Spec.Grav. 1.020  1.001 - 1.035     Blood Trace  (none)     pH 6.5  4.6 - 8.0     Protein 30  ZOX:WRUEAVWU(JW/JX)    Urobilinogen 1 mg/dL      Nitrites Negative  (none)     Leukocyte esterase Trace  (none)    AMB POC PVR, MEAS,POST-VOID RES,US,NON-IMAGING       Component Value Range    PVR 128       Component       PSA (UVA/ABBOTT)   Latest Ref Rng       0.0 - 4.0 ng/ml   03/14/2010      10:48 AM 3.1   01/09/2010      10:29 AM 4.8 (H)     Total visit time was 15 minutes of which more than 50% of the time was spent in  coordination and counseling of BPH.    This note has been sent to the referring physician, with findings and recommendations.      Ruben Reason, MD  Assistant Professor  [facility] Dept of Urology  [facility] [place] [place]

## 2011-06-08 NOTE — Progress Notes (Signed)
Date of Birth was verbally confirmed by patient  Blood was drawn by venipuncture. Specimen sent to lab.   Patient tolerated procedure without incident.     1 YELLOW SST   By.. J.D.Seleste Tallman

## 2011-06-11 LAB — PSA, DIAGNOSTIC (PROSTATE SPECIFIC AG): Prostate Specific Ag: 2.29 ng/ml (ref 0.00–4.00)

## 2011-06-12 NOTE — Progress Notes (Signed)
Quick Note:    PSA 2.29. Looks ok.  Notify pt  ______

## 2011-07-17 LAB — AMB POC URINALYSIS DIP STICK AUTO W/O MICRO
Bilirubin (UA POC): NEGATIVE
Glucose (UA POC): NEGATIVE
Ketones (UA POC): NEGATIVE
Leukocyte esterase (UA POC): NEGATIVE
Nitrites (UA POC): NEGATIVE
Protein (UA POC): NEGATIVE mg/dL
Specific gravity (UA POC): 1.015 (ref 1.001–1.035)
Urobilinogen (UA POC): 0.2 (ref 0.2–1)
pH (UA POC): 6 (ref 4.6–8.0)

## 2011-07-17 LAB — AMB POC PVR, MEAS,POST-VOID RES,US,NON-IMAGING: PVR: 284 cc

## 2011-07-17 NOTE — Progress Notes (Signed)
ASSESSMENT:     1. BPH w urinary obs/LUTS  AMB POC URINALYSIS DIP STICK AUTO W/O MICRO, [place] RETROPERITONEUM COMP, [place] RETROPERITONEUM COMP   2. Urinary retention  AMB POC URINALYSIS DIP STICK AUTO W/O MICRO, AMB POC PVR, MEAS,POST-VOID RES,US,NON-IMAGING, [place] RETROPERITONEUM COMP, [place] RETROPERITONEUM COMP       BPH s/p greenlight PVP 08/2010.  Possible neurogenic bladder.  High PVR but he feels he's voiding well and doesn't want to self cath.  Will check RUS to r/o hydro.  Discussed risk of upper tract compromise with poor bladder emptying and he expressed understanding.  If RUS looks ok, will see him back in 6mos with another RUS prior to that appt.        SUBJECTIVE:     CC: BPH    [name] is a 70 y.o. male  who presents for follow up.  Had PVP 08/2010.  Was cathing pre-op.  Post op he was able to get off CIC, but still with high PVR.  Last 2 visits had PVR of 128 today and 188. Higher today.  However feels he's voiding well.  Does double void at night.  Daytime frequency is every 2-3 hours.  Nocturia is 1x.  No UTIs.  No hematuria.  FOS is "pretty good."      No flank pain.     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    Past Medical History   Diagnosis Date   ??? Non Hodgkin's lymphoma    ??? Hypertrophy of prostate with urinary obstruction and other lower urinary tract symptoms (LUTS)    ??? Elevated prostate specific antigen (PSA)    ??? Retention of urine, unspecified      Past Surgical History   Procedure Date   ??? Hx other surgical 1978-1979   ??? Hx hernia repair 1980s   ??? Pr laser vaporization surgery prostate, complete 08/2010     Current Outpatient Prescriptions   Medication Sig   ??? acetaminophen (TYLENOL) 325 mg tablet Take  by mouth every four (4) hours as needed.        No Known Allergies  History     Social History   ??? Marital Status: Unknown     Spouse Name: N/A     Number of Children: N/A   ??? Years of Education: N/A     Occupational History   ??? Not on file.      Social History Main Topics   ??? Smoking status: Never Smoker    ??? Smokeless tobacco: Not on file   ??? Alcohol Use: No   ??? Drug Use:    ??? Sexually Active:      Other Topics Concern   ??? Not on file     Social History Narrative   ??? No narrative on file        Review of Systems:   Per HPI    OBJECTIVE:     PHYSICAL EXAM:  Filed Vitals:    07/17/11 1129   BP: 132/82   Height: 5\' 10"  (1.778 m)   Weight: 192 lb (87.091 kg)      Body mass index is 27.55 kg/(m^2).     GEN: NAD, alert and oriented  PULM: nl resp effort, no distress  ABD: benign, nontender, nondistended  EXT: well perfused  PSYCH: Nl mood and affect      Review of Labs, Medical Images, tracings or specimens:    Results for orders placed in visit on 07/17/11  AMB POC URINALYSIS DIP STICK AUTO W/O MICRO       Component Value Range    Color Yellow  (none)    Clarity Clear  (none)    Glucose Negative  (none)    Bilirubin Negative  (none)    Ketones Negative  (none)    Spec.Grav. 1.015  1.001 - 1.035    Blood Trace  (none)    pH 6.0  4.6 - 8.0    Protein neg  Negative mg/dL    Urobilinogen 0.2 mg/dL  0.2 - 1    Nitrites Negative  (none)    Leukocyte esterase Negative  (none)   AMB POC PVR, MEAS,POST-VOID RES,US,NON-IMAGING       Component Value Range    PVR 284       PSA /TESTOSTERONE - BSHSI PSA   Latest Ref Rng 0.00 - 4.00 ng/ml   06/08/2011 2.290   03/14/2010 3.1   01/09/2010 4.8 (H)       Ruben Reason, MD  Assistant Professor  [facility] Dept of Urology  [facility] [place], [place]

## 2011-07-17 NOTE — Patient Instructions (Addendum)
MyChart Activation    Thank you for requesting access to MyChart. Please follow the instructions below to securely access and download your online medical record. MyChart allows you to send messages to your doctor, view your test results, renew your prescriptions, schedule appointments, and more.    How Do I Sign Up?    1. In your internet browser, go to [email or web address]  2. Click on the First Time User? Click Here link in the Sign In box. You will be redirect to the New Member Sign Up page.  3. Enter your MyChart Access Code exactly as it appears below. You will not need to use this code after you???ve completed the sign-up process. If you do not sign up before the expiration date, you must request a new code.    MyChart Access Code: [email or web address]  Expires: 10/15/2011 12:18 PM (This is the date your MyChart access code will expire)    4. Enter the last four digits of your Social Security Number (xxxx) and Date of Birth (mm/dd/yyyy) as indicated and click Submit. You will be taken to the next sign-up page.  5. Create a MyChart ID. This will be your MyChart login ID and cannot be changed, so think of one that is secure and easy to remember.  6. Create a MyChart password. You can change your password at any time.  7. Enter your Password Reset Question and Answer. This can be used at a later time if you forget your password.   8. Enter your e-mail address. You will receive e-mail notification when new information is available in MyChart.  9. Click Sign Up. You can now view and download portions of your medical record.  10. Click the Download Summary menu link to download a portable copy of your medical information.    Additional Information    If you have questions, please visit the Frequently Asked Questions section of the MyChart website at [email or web address] Remember, MyChart is NOT to be used for urgent needs. For medical emergencies, dial 911.

## 2011-07-27 NOTE — Progress Notes (Signed)
Quick Note:    No hydro on RUS. Please notify pt.  ______

## 2011-08-02 ENCOUNTER — Encounter

## 2011-08-02 NOTE — Progress Notes (Signed)
Quick Note:    No hydronephrosis on RUS.  Looks ok.  Notify pt.  ______

## 2012-01-10 ENCOUNTER — Encounter

## 2012-02-21 ENCOUNTER — Encounter

## 2012-02-22 NOTE — Progress Notes (Signed)
Quick Note:    RUS ok. No hydronephrosis. Notify pt.  F/u as scheduled.  ______

## 2012-03-06 LAB — AMB POC URINALYSIS DIP STICK AUTO W/O MICRO
Bilirubin (UA POC): NEGATIVE
Glucose (UA POC): NEGATIVE
Ketones (UA POC): NEGATIVE
Leukocyte esterase (UA POC): NEGATIVE
Nitrites (UA POC): NEGATIVE
Protein (UA POC): NEGATIVE mg/dL
Specific gravity (UA POC): 1.015 (ref 1.001–1.035)
Urobilinogen (UA POC): 0.2 (ref 0.2–1)
pH (UA POC): 6.5 (ref 4.6–8.0)

## 2012-03-06 LAB — AMB POC PVR, MEAS,POST-VOID RES,US,NON-IMAGING: PVR: 283 cc

## 2012-03-06 NOTE — Patient Instructions (Signed)
MyChart Activation    Thank you for requesting access to MyChart. Please follow the instructions below to securely access and download your online medical record. MyChart allows you to send messages to your doctor, view your test results, renew your prescriptions, schedule appointments, and more.    How Do I Sign Up?    1. In your internet browser, go to [email or web address]  2. Click on the First Time User? Click Here link in the Sign In box. You will be redirect to the New Member Sign Up page.  3. Enter your MyChart Access Code exactly as it appears below. You will not need to use this code after you???ve completed the sign-up process. If you do not sign up before the expiration date, you must request a new code.    MyChart Access Code: [email or web address]  Expires: 06/04/2012 11:54 AM (This is the date your MyChart access code will expire)    4. Enter the last four digits of your Social Security Number (xxxx) and Date of Birth (mm/dd/yyyy) as indicated and click Submit. You will be taken to the next sign-up page.  5. Create a MyChart ID. This will be your MyChart login ID and cannot be changed, so think of one that is secure and easy to remember.  6. Create a MyChart password. You can change your password at any time.  7. Enter your Password Reset Question and Answer. This can be used at a later time if you forget your password.   8. Enter your e-mail address. You will receive e-mail notification when new information is available in MyChart.  9. Click Sign Up. You can now view and download portions of your medical record.  10. Click the Download Summary menu link to download a portable copy of your medical information.    Additional Information    If you have questions, please visit the Frequently Asked Questions section of the MyChart website at [email or web address] Remember, MyChart is NOT to be used for urgent needs. For medical emergencies, dial 911.

## 2012-03-06 NOTE — Progress Notes (Signed)
ASSESSMENT:     1. BPH w urinary obs/LUTS  AMB POC URINALYSIS DIP STICK AUTO W/O MICRO, METABOLIC PANEL, BASIC, PR COLLECTION VENOUS BLOOD,VENIPUNCTURE   2. Urinary retention  AMB POC URINALYSIS DIP STICK AUTO W/O MICRO, AMB POC PVR, MEAS,POST-VOID RES,US,NON-IMAGING, METABOLIC PANEL, BASIC, PR COLLECTION VENOUS BLOOD,VENIPUNCTURE       Hx of urinary retn s/p greenlight PVP 08/2010.  He feels he's doing well.     PVR high.  Discussed cysto to r/o obstruction but he declined.  I believe high PVR is due to NGB.  Need to monitor upper tracts for any signs of compromise.  BMP today.  Recent RUS negative for hydro.    RUS in 6mos -- will notify him of results.  Plan to see back in one year with RUS prior.      SUBJECTIVE:     CC: BPH    [name] is a 71 y.o. male  who presents for follow up.  Had PVP 08/2010.  Was cathing pre-op.  Post op he was able to get off CIC, but still with high PVR.      Feels he's doing well.  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    Feel's he empties well.  No UTIs.  Good FOS.  No gross hematuria.  No flank pain.    Past Medical History   Diagnosis Date   ??? Non Hodgkin's lymphoma    ??? Hypertrophy of prostate with urinary obstruction and other lower urinary tract symptoms (LUTS)    ??? Elevated prostate specific antigen (PSA)    ??? Retention of urine, unspecified      Past Surgical History   Procedure Date   ??? Hx other surgical 1978-1979   ??? Hx hernia repair 1980s   ??? Pr laser vaporization surgery prostate, complete 08/2010     Current Outpatient Prescriptions   Medication Sig   ??? acetaminophen (TYLENOL) 325 mg tablet Take  by mouth every four (4) hours as needed.        No Known Allergies  History     Social History   ??? Marital Status: UNKNOWN     Spouse Name: N/A     Number of Children: N/A   ??? Years of Education: N/A     Occupational History   ??? Not on file.     Social History Main Topics   ??? Smoking status: Never Smoker    ??? Smokeless  tobacco: Not on file   ??? Alcohol Use: No   ??? Drug Use:    ??? Sexually Active:      Other Topics Concern   ??? Not on file     Social History Narrative   ??? No narrative on file        Review of Systems:   Per HPI    OBJECTIVE:     PHYSICAL EXAM:  Filed Vitals:    03/06/12 1110   BP: 132/74   Height: 5\' 10"  (1.778 m)   Weight: 182 lb (82.555 kg)      Body mass index is 26.11 kg/(m^2).     GEN: NAD, alert and oriented  PULM: nl resp effort, no distress  ABD: benign, nontender, nondistended  PSYCH: Nl mood and affect      Review of Labs, Medical Images, tracings or specimens:    Results for orders placed in visit on 03/06/12   AMB POC URINALYSIS DIP STICK AUTO W/O MICRO  Component Value Range    Color Yellow  (none)    Clarity Clear  (none)    Glucose Negative  (none)    Bilirubin Negative  (none)    Ketones Negative  (none)    Spec.Grav. 1.015  1.001 - 1.035    Blood Trace  (none)    pH 6.5  4.6 - 8.0    Protein, Urine Negative  Negative mg/dL    Urobilinogen 0.2 mg/dL  0.2 - 1    Nitrites Negative  (none)    Leukocyte esterase Negative  (none)   AMB POC PVR, MEAS,POST-VOID RES,US,NON-IMAGING       Component Value Range    PVR 283       PSA /TESTOSTERONE - [facility] PSA   Latest Ref Rng 0.00 - 4.00 ng/ml   06/08/2011 2.290   03/14/2010 3.1   01/09/2010 4.8 (H)     IMAGING:  RUS 01/2012: no Osborne Casco, MD  Assistant Professor  [facility] Dept of Urology  [facility] [place] [place]     CC: Emilie Rutter, MD

## 2012-03-07 LAB — METABOLIC PANEL, BASIC
BUN/Creatinine ratio: 16 (ref 10–22)
BUN: 16 mg/dL (ref 8–27)
CO2: 22 mmol/L (ref 20–32)
Calcium: 9.6 mg/dL (ref 8.6–10.2)
Chloride: 103 mmol/L (ref 97–108)
Creatinine: 1.02 mg/dL (ref 0.76–1.27)
GFR est non-AA: 75 mL/min/{1.73_m2} (ref >59)
Glucose: 89 mg/dL (ref 65–99)
Potassium: 4.7 mmol/L (ref 3.5–5.2)
Sodium: 142 mmol/L (ref 134–144)
eGFR If African American: 86 mL/min/{1.73_m2} (ref >59)

## 2012-03-07 NOTE — Progress Notes (Signed)
Quick Note:    Cr normal  Kidney function good  Forward to pt.  ______

## 2012-03-17 ENCOUNTER — Encounter

## 2012-10-07 LAB — TSH: TSH, External: 1.8 u[IU]/mL (ref 0.3–5)

## 2013-03-04 NOTE — Progress Notes (Signed)
Renal Ultrasound per office protocol.

## 2013-03-06 NOTE — Progress Notes (Signed)
Quick Note:    Noted   Fwd to pt  ______

## 2013-03-16 DIAGNOSIS — M419 Scoliosis, unspecified: Secondary | ICD-10-CM | POA: Insufficient documentation

## 2013-04-22 DIAGNOSIS — N281 Cyst of kidney, acquired: Secondary | ICD-10-CM | POA: Insufficient documentation

## 2013-04-22 LAB — AMB POC URINALYSIS DIP STICK AUTO W/O MICRO
Bilirubin (UA POC): NEGATIVE
Ketones (UA POC): NEGATIVE
Leukocyte esterase (UA POC): NEGATIVE
Nitrites (UA POC): NEGATIVE
Protein (UA POC): NEGATIVE mg/dL
Specific gravity (UA POC): 1.025 (ref 1.001–1.035)
Urobilinogen (UA POC): 0.2 (ref 0.2–1)
pH (UA POC): 5.5 (ref 4.6–8.0)

## 2013-04-22 NOTE — Progress Notes (Signed)
ASSESSMENT:     ICD-9-CM    1. BPH w urinary obs/LUTS 600.01 AMB POC URINALYSIS DIP STICK AUTO W/O MICRO   2. Urinary retention 788.20 AMB POC URINALYSIS DIP STICK AUTO W/O MICRO   3. Renal cyst 753.10          Hx of urinary retn s/p greenlight PVP 08/2010.  Voiding very well by his account and he is very pleased.  No retention related problems but PVRs have been high in the past and likely still are, based on RUS.  Nevertheless, no hydro on recent U/S and Cr normal.  No further treatment needed.    Bilateral renal cysts have been noted previously and are benign, asymptomatic.  No treatment or surveillance needed.     PLAN:    F/U PRN.          Chief Complaint   Patient presents with   ??? Benign Prostatic Hypertrophy     LUTS   ??? Urinary Retention       HISTORY OF PRESENT ILLNESS:  [name] is a 72 y.o. male f/u for BPH/Urinary retn.  Doing well.  Feels he is voiding very well.  No retn sxs -- no UTIs, bladder pain, or hematuria.  No flank pain or renal colic.  No incontinence.    AUA Assessment Score:  AUA Score: 15;    AUA Bother Rating: Delighted     Review of Systems  Constitutional: Fever: No  Skin: Rash: No  HEENT: Hearing difficulty: Yes  Eyes: Blurred vision: No  Cardiovascular: Chest pain: No  Respiratory: Shortness of breath: No  Gastrointestinal: Nausea/vomiting: No  Musculoskeletal: Back pain:   Neurological: Weakness: No  Psychological: Memory loss: No  Comments/additional findings:       AUA Symptom Score 04/22/2013   Over the past month how often have you had the sensation that your bladder was not completely empty after you finished urinating? 0   Over the past month, how often have had to urinate again less than 2 hours after you last finished urinating? 4   Over the past month, how often have you found you stopped and started again several times when you urinated? 4   Over the past month, how often have you found it difficult to postpone urination? 3   Over the past month, how often have  you had a weak urinary stream? 1   Over the past month, how often have you had to push or strain to begin urinating? 1   Over the past month, how many times did you most typically get up to urinate from the time you went to bed at night until the time you got up in the morning? 2   AUA Score 15   If you were to spend the rest of your life with your urinary condition the way it is now, how would you feel about that? Delighted        Past Medical History   Diagnosis Date   ??? Non Hodgkin's lymphoma    ??? Hypertrophy of prostate with urinary obstruction and other lower urinary tract symptoms (LUTS)    ??? Retention of urine, unspecified        Past Surgical History   Procedure Laterality Date   ??? Hx other surgical  1978-1979   ??? Hx hernia repair  1980s   ??? Pr laser vaporization surgery prostate, complete  08/2010       History   Substance Use Topics   ???  Smoking status: Never Smoker    ??? Smokeless tobacco: Not on file   ??? Alcohol Use: No       No Known Allergies    Family History   Problem Relation Age of Onset   ??? Cancer Brother      prostate   ??? Diabetes Brother    ??? Cancer Brother      prostate   ??? Diabetes Brother        Current Outpatient Prescriptions   Medication Sig Dispense Refill   ??? acetaminophen (TYLENOL) 325 mg tablet Take  by mouth every four (4) hours as needed.               PHYSICAL EXAMINATION:   BP 120/76   Ht 5' 10.5" (1.791 m)   Wt 175 lb (79.379 kg)   BMI 24.75 kg/m2  Constitutional: WDWN, Pleasant and appropriate affect, No acute distress.    CV:  No peripheral swelling noted  Respiratory: No respiratory distress or difficulties  Abdomen:  No abdominal masses or tenderness. No CVA tenderness. No inguinal hernias noted.   GU Male:    [id number] normal to visual inspection, no erythema or irritation, Sphincter with good tone, Rectum with no hemorrhoids, fissures or masses, Prostate smooth, symmetric and anodular. Prostate is about 45 gm.  SCROTUM:  No scrotal rash or lesions noticed.  Normal bilateral  testes and epididymis.   PENIS: Urethral meatus normal in location and size. No urethral discharge.  Skin: No jaundice.    Neuro/Psych:  Alert and oriented x 3, affect appropriate.       REVIEW OF LABS AND IMAGING:      Results for orders placed in visit on 04/22/13   AMB POC URINALYSIS DIP STICK AUTO W/O MICRO       Result Value Range    Color (UA POC) Yellow      Clarity (UA POC) Clear      Glucose (UA POC) 1+  Negative    Bilirubin (UA POC) Negative  Negative    Ketones (UA POC) Negative  Negative    Specific gravity (UA POC) 1.025  1.001 - 1.035    Blood (UA POC) Trace  Negative    pH (UA POC) 5.5  4.6 - 8.0    Protein (UA POC) Negative  Negative mg/dL    Urobilinogen (UA POC) 0.2 mg/dL  0.2 - 1    Nitrites (UA POC) Negative  Negative    Leukocyte esterase (UA POC) Negative  Negative     CMP 02/24/13  Value  Range   ALBUMIN 4.6   3.5 - 5.0 G/DL   BILIRUBIN TOTAL 0.9   0.2 - 1.2 MG/DL   CALCIUM 9.4   8.4 - 98.1 MG/DL   CHLORIDE 191   98 - 478 MMOL/L   CREATININE 1.0   0.8 - 1.6 MG/DL   GFR AGE 72     GFR RACE OTHER/UNK     Comments: 1)Amer Ind 2)Asian 3)African Amer 4)Hispanic  5)Caucasian 6)Other/Unknown  7)Pacific Islander   GFR CALCULATION >60   >60   Comments: Results reported in mL/min/1.73 sq m.  For African American individuals where race was not specified,  please  multiply the GFR by 1.21.  This eGFR is validated for stable chronic renal failure patients.  This  equation is unreliable in acute illness or patients with normal  renal  function.   GLUCOSE 95   65 - 99 MG/DL   ALKALINE PHOSPHATASE 83  40 - 125 U/L   POTASSIUM 5.1   3.5 - 5.5 MMOL/L   TOTAL PROTEIN 6.0 (L)   6.2 - 8.1 G/DL   SODIUM 161   096 - 045 MMOL/L   SGOT (AST) 21   10 - 37 U/L   BUN 20   6 - 22 MG/DL   GLOBULIN SERUM 1.4 (L)   2.0 - 4.0 G/DL   A/G RATIO 3.2 (H)   1.1 - 2.6 RATIO   CO2 26   20 - 30 MMOL/L   SGPT (ALT) 14   5 - 40 U/L   ANION GAP 12.0     Specimen Collected: 02/24/13     PSA 02/24/13  2.7    RUS 03/04/13  Indications: BPH,  Urinary Retention    Technique: Real-Time sonographic examination performed of  kidneys, ureters and bladder and Prostate using a curved array 5  to 2 MHz ultrasound transducer.    Procedure: Retroperitoneal and renal ultrasound.    Narrative:  The LEFT kidney measures 11.1 x 5.67 x 6.93cm. There were  multiple hypoechoic cysts noted, with the two largest measured.   Cyst1 located in the upper lateral pole appeared simple and  measured 11.9 x 10.1 x 7.66 cm, Cyst2 located medially appeared  simple and measured 4.10 x 3.01 x 3.94 cm. There was no  perfusion in either cyst. Normal echotexture and blood flow by  color Doppler noted throughout. There was no hydronephrosis  noted.    The RIGHT kidney measures 12.1 x 5.55 x 5.39 cm. There were  multiple hypoechoic cyst noted, with the two largest measured.   Cyst1 located in the upper pole appeared simple and measured 7.04  x 7.15 x 8.28 cm, Cyst2 located in the mid-inferior pole appeared  simple and measured 4.24 x 4.36 x 5.76 cm. There was no  perfusion in either cyst. Normal echotexture and blood flow by  color Doppler noted throughout. There was no hydronephrosis  noted.    The bladder was imaged and appeared normal. Elevated postvoid  residual of 250cc.  The prostate was imaged and measured 38grams    Impression:  Bilateral multiple simple appearing cysts, measurements as above.  No hydronephrosis.  Elevated post void residual.    The images were reviewed and read by:    Eyvonne Left MD  [facility]      Imaging Report Reviewed?  YES   Type:  RUS  Images Reviewed?      YES      Type:   RUS    Other Lab Data Reviewed?   YES    Cr and PSA and UA        Ruben Reason, MD   Assistant Professor   [facility] Dept of Urology   [facility] [place] [place]    CC Lesia Sago, MD

## 2013-06-16 LAB — CREATININE, POC
Creatinine: 1.2 mg/dl (ref 0.6–1.3)
Performed by: 57521

## 2013-07-01 DIAGNOSIS — C8338 Diffuse large B-cell lymphoma, lymph nodes of multiple sites: Secondary | ICD-10-CM | POA: Insufficient documentation

## 2013-07-08 LAB — FLOW CYTOMETRY,PERIPHERAL BLOOD
Number of markers: 22
Viability: 41 %

## 2013-07-08 LAB — FLOW CYTOMETRY, PERIPHERAL BLOOD
NUMBER OF MARKERS: 22
VIABILITY, 35080B: 41 %

## 2013-08-07 LAB — POC PT/INR
INR: 1.1 (ref 0.0–1.1)
Prothrombin time: 12.8 seconds (ref 0.0–14.0)

## 2014-03-23 DIAGNOSIS — N319 Neuromuscular dysfunction of bladder, unspecified: Secondary | ICD-10-CM | POA: Insufficient documentation

## 2014-03-23 DIAGNOSIS — R32 Unspecified urinary incontinence: Secondary | ICD-10-CM | POA: Insufficient documentation

## 2014-06-03 NOTE — Progress Notes (Signed)
appt cxl'd pt to f/u prn instead

## 2014-06-09 LAB — AMB POC PVR, MEAS,POST-VOID RES,US,NON-IMAGING: PVR: 498 cc

## 2014-06-09 LAB — AMB POC URINALYSIS DIP STICK AUTO W/O MICRO
Bilirubin (UA POC): NEGATIVE
Blood (UA POC): NEGATIVE
Glucose (UA POC): NEGATIVE
Ketones (UA POC): NEGATIVE
Leukocyte esterase (UA POC): NEGATIVE
Nitrites (UA POC): NEGATIVE
Protein (UA POC): NEGATIVE mg/dL
Specific gravity (UA POC): 1.015 (ref 1.001–1.035)
Urobilinogen (UA POC): 0.2 (ref 0.2–1)
pH (UA POC): 7 (ref 4.6–8.0)

## 2014-06-09 MED ORDER — SILODOSIN 8 MG CAPSULE
8 mg | ORAL_CAPSULE | Freq: Every day | ORAL | Status: DC
Start: 2014-06-09 — End: 2014-07-14

## 2014-06-09 NOTE — Progress Notes (Signed)
ASSESSMENT:     ICD-9-CM ICD-10-CM    1. Nocturia 788.43 R35.1 AMB POC PVR, MEAS,POST-VOID RES,US,NON-IMAGING   2. BPH w urinary obs/LUTS 600.01 N40.1 AMB POC URINALYSIS DIP STICK AUTO W/O MICRO      METABOLIC PANEL, BASIC      PR COLLECTION VENOUS BLOOD,VENIPUNCTURE   3. Renal cyst 753.10 Q61.00          Hx of urinary retn s/p greenlight PVP 08/2010.  Voiding very well by his account.  No retention related problems but PVRs have been high in the past and higher now: 498 mL today.  Previously, there was no hydro on U/S and Cr normal at 0.8. However, his recent renal function/imaging is not available.     Bilateral renal cysts have been noted previously and are benign, asymptomatic.  No treatment or surveillance needed.    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     PLAN:    Will trial Rapaflo daily  PVR at next visit  BMP today  RUS prior to next visit  F/u in 4-8 weeks to review RUS., BMP and PVR following rapaflo trial.   Cysto if PVR still high PVR.        Chief Complaint   Patient presents with   ??? Benign Prostatic Hypertrophy   ??? Renal Cyst       HISTORY OF PRESENT ILLNESS:  [name] is a 73 y.o. male f/u for BPH/Urinary retn.  Doing well.  Feels he is voiding very well.  No retn sxs -- no UTIs, bladder pain, or hematuria. However his PVR's are still elevated up to 495 mL's. Recently diagnosed with diffuse large B-cell lymphoma and finished R-CHOP x 3 cycles with a good response.   No flank pain or renal colic.  No incontinence.    AUA Assessment Score:  AUA Score: 16;    AUA Bother Rating: Mostly dissatisfied     Review of Systems  Constitutional: Fever: No;  Skin: Rash: No;  HEENT: Hearing difficulty: No;  Eyes: Blurred vision: No;  Cardiovascular: Chest pain: No;  Respiratory: Shortness of breath: No;  Gastrointestinal: Nausea/vomiting: No;  Musculoskeletal: Back pain: Yes;   Neurological: Weakness: No;  Psychological: Memory loss: No;  Comments/additional findings:            Past Medical History   Diagnosis Date   ??? Non Hodgkin's lymphoma ([place])    ??? Hypertrophy of prostate with urinary obstruction and other lower urinary tract symptoms (LUTS)    ??? Retention of urine, unspecified        Past Surgical History   Procedure Laterality Date   ??? Hx other surgical  1978-1979   ??? Hx hernia repair  1980s   ??? Pr laser vaporization surgery prostate, complete  08/2010       History   Substance Use Topics   ??? Smoking status: Never Smoker    ??? Smokeless tobacco: Not on file   ??? Alcohol Use: No       No Known Allergies    Family History   Problem Relation Age of Onset   ??? Cancer Brother      prostate   ??? Diabetes Brother    ??? Cancer Brother      prostate   ??? Diabetes Brother        Current Outpatient Prescriptions   Medication Sig Dispense Refill   ???  silodosin (RAPAFLO) 8 mg capsule Take 1 Cap by mouth daily (with dinner). 90 Cap 3   ??? acetaminophen (TYLENOL) 325 mg tablet Take  by mouth every four (4) hours as needed.             PHYSICAL EXAMINATION:   BP 118/72 mmHg   Ht 5' 10" (1.778 m)   Wt 175 lb (79.379 kg)   BMI 25.11 kg/m2  Constitutional: WDWN, Pleasant and appropriate affect, No acute distress.    CV:  No peripheral swelling noted  Respiratory: No respiratory distress or difficulties  Abdomen:  No abdominal masses or tenderness. No CVA tenderness. No inguinal hernias noted.   GU Male:    [id number] normal to visual inspection, no erythema or irritation, Sphincter with good tone, Rectum with no hemorrhoids, fissures or masses, Prostate smooth, symmetric and anodular. Prostate is about 45 gm.  SCROTUM:  No scrotal rash or lesions noticed.  Normal bilateral testes and epididymis.   PENIS: Urethral meatus normal in location and size. No urethral discharge.  Skin: No jaundice.          REVIEW OF LABS AND IMAGING:      Results for orders placed or performed in visit on 06/09/14    AMB POC URINALYSIS DIP STICK AUTO W/O MICRO   Result Value Ref Range    Color (UA POC) Yellow     Clarity (UA POC) Clear     Glucose (UA POC) Negative Negative    Bilirubin (UA POC) Negative Negative    Ketones (UA POC) Negative Negative    Specific gravity (UA POC) 1.015 1.001 - 1.035    Blood (UA POC) Negative Negative    pH (UA POC) 7.0 4.6 - 8.0    Protein (UA POC) Negative Negative mg/dL    Urobilinogen (UA POC) 0.2 mg/dL 0.2 - 1    Nitrites (UA POC) Negative Negative    Leukocyte esterase (UA POC) Negative Negative   AMB POC PVR, MEAS,POST-VOID RES,US,NON-IMAGING   Result Value Ref Range    PVR 498 cc     CMP 02/24/13  Value  Range   ALBUMIN 4.6   3.5 - 5.0 G/DL   BILIRUBIN TOTAL 0.9   0.2 - 1.2 MG/DL   CALCIUM 9.4   8.4 - 10.4 MG/DL   CHLORIDE 105   98 - 110 MMOL/L   CREATININE 1.0   0.8 - 1.6 MG/DL   GFR AGE 2     GFR RACE OTHER/UNK     Comments: 1)Amer Ind 2)Asian 3)African Amer 4)Hispanic  5)Caucasian 6)Other/Unknown  7)Pacific Islander   GFR CALCULATION >60   >60   Comments: Results reported in mL/min/1.73 sq m.  For African American individuals where race was not specified,  please  multiply the GFR by 1.21.  This eGFR is validated for stable chronic renal failure patients.  This  equation is unreliable in acute illness or patients with normal  renal  function.   GLUCOSE 95   65 - 99 MG/DL   ALKALINE PHOSPHATASE 83   40 - 125 U/L   POTASSIUM 5.1   3.5 - 5.5 MMOL/L   TOTAL PROTEIN 6.0 (L)   6.2 - 8.1 G/DL   SODIUM 143   133 - 145 MMOL/L   SGOT (AST) 21   10 - 37 U/L   BUN 20   6 - 22 MG/DL   GLOBULIN SERUM 1.4 (L)   2.0 - 4.0 G/DL   A/G RATIO 3.2 (H)  1.1 - 2.6 RATIO   CO2 26   20 - 30 MMOL/L   SGPT (ALT) 14   5 - 40 U/L   ANION GAP 12.0     Specimen Collected: 02/24/13     PSA 02/24/13  2.7    RUS 03/04/13  Indications: BPH, Urinary Retention    Technique: Real-Time sonographic examination performed of  kidneys, ureters and bladder and Prostate using a curved array 5  to 2 MHz ultrasound transducer.     Procedure: Retroperitoneal and renal ultrasound.    Narrative:  The LEFT kidney measures 11.1 x 5.67 x 6.93cm. There were  multiple hypoechoic cysts noted, with the two largest measured.   Cyst1 located in the upper lateral pole appeared simple and  measured 11.9 x 10.1 x 7.66 cm, Cyst2 located medially appeared  simple and measured 4.10 x 3.01 x 3.94 cm. There was no  perfusion in either cyst. Normal echotexture and blood flow by  color Doppler noted throughout. There was no hydronephrosis  noted.    The RIGHT kidney measures 12.1 x 5.55 x 5.39 cm. There were  multiple hypoechoic cyst noted, with the two largest measured.   Cyst1 located in the upper pole appeared simple and measured 7.04  x 7.15 x 8.28 cm, Cyst2 located in the mid-inferior pole appeared  simple and measured 4.24 x 4.36 x 5.76 cm. There was no  perfusion in either cyst. Normal echotexture and blood flow by  color Doppler noted throughout. There was no hydronephrosis  noted.    The bladder was imaged and appeared normal. Elevated postvoid  residual of 250cc.  The prostate was imaged and measured 38grams    Impression:  Bilateral multiple simple appearing cysts, measurements as above.  No hydronephrosis.  Elevated post void residual.    The images were reviewed and read by:    Marc Morgans MD  [facility] [place]      Imaging Report Reviewed?  YES   Type:  RUS  Images Reviewed?      YES      Type:   RUS    Other Lab Data Reviewed?   YES    UA    Violet Baldy, MD  Urology, PGY-3  Pager: [phone]  06/09/2014, 3:29 PM      Pt was seen with Dr Algernon Huxley.  I was present during the visit and oversaw/directed the management as documented above.  I have reviewed the above note and I agree with the history, assessment, and plan as outlined.          Vance Gather, MD   Assistant Professor   [facility] Dept of Urology   [facility] [place] [place]    CC Alveta Heimlich, MD

## 2014-06-10 LAB — METABOLIC PANEL, BASIC
BUN/Creatinine ratio: 27 — ABNORMAL HIGH (ref 10–22)
BUN: 26 mg/dL (ref 8–27)
CO2: 28 mmol/L (ref 18–29)
Calcium: 9.5 mg/dL (ref 8.6–10.2)
Chloride: 100 mmol/L (ref 97–108)
Creatinine: 0.98 mg/dL (ref 0.76–1.27)
GFR est AA: 89 mL/min/{1.73_m2} (ref >59)
GFR est non-AA: 77 mL/min/{1.73_m2} (ref >59)
Glucose: 84 mg/dL (ref 65–99)
Potassium: 5.2 mmol/L (ref 3.5–5.2)
Sodium: 144 mmol/L (ref 134–144)

## 2014-06-10 NOTE — Progress Notes (Signed)
Quick Note:        Looks good    Fwd to pt    ______

## 2014-07-06 ENCOUNTER — Ambulatory Visit: Admit: 2014-07-06 | Discharge: 2014-07-06 | Payer: MEDICARE | Primary: Internal Medicine

## 2014-07-06 DIAGNOSIS — N281 Cyst of kidney, acquired: Secondary | ICD-10-CM

## 2014-07-06 NOTE — Progress Notes (Signed)
Renal ultrasound done per office protocol.

## 2014-07-08 NOTE — Progress Notes (Signed)
Quick Note:        Fwd to pt    F/u as scheduled.    ______

## 2014-07-14 ENCOUNTER — Ambulatory Visit: Admit: 2014-07-14 | Discharge: 2014-07-14 | Payer: MEDICARE | Attending: Urology | Primary: Internal Medicine

## 2014-07-14 DIAGNOSIS — N400 Enlarged prostate without lower urinary tract symptoms: Secondary | ICD-10-CM

## 2014-07-14 LAB — AMB POC URINALYSIS DIP STICK AUTO W/O MICRO
Bilirubin (UA POC): NEGATIVE
Blood (UA POC): NEGATIVE
Glucose (UA POC): NEGATIVE
Ketones (UA POC): NEGATIVE
Leukocyte esterase (UA POC): NEGATIVE
Nitrites (UA POC): NEGATIVE
Protein (UA POC): NEGATIVE mg/dL
Specific gravity (UA POC): 1.015 (ref 1.001–1.035)
Urobilinogen (UA POC): 0.2 (ref 0.2–1)
pH (UA POC): 8 (ref 4.6–8.0)

## 2014-07-14 LAB — AMB POC PVR, MEAS,POST-VOID RES,US,NON-IMAGING: PVR: 376 cc

## 2014-07-14 MED ORDER — TAMSULOSIN SR 0.4 MG 24 HR CAP
0.4 mg | ORAL_CAPSULE | Freq: Every day | ORAL | Status: DC
Start: 2014-07-14 — End: 2014-10-19

## 2014-07-14 MED ORDER — TAMSULOSIN SR 0.4 MG 24 HR CAP
0.4 mg | ORAL_CAPSULE | Freq: Every day | ORAL | Status: DC
Start: 2014-07-14 — End: 2014-07-14

## 2014-07-14 NOTE — Progress Notes (Signed)
CYSTOSCOPY PROCEDURE    Patient Name: [name]            Date of Procedure: 07/14/2014     Procedure: Cystoscopy    This procedure has been fully reviewed with the patient, and written informed consent has been obtained.    History / Imaging:       [name] presents today for cysto 2/2 incomplete bladder emptying. PVR today 376cc.       Exam:    Lidocaine Jelly: Yes            Scope: Flexible Scope  Meatus: Normal  Urethra: Normal  Prostate: obstructing:  Mild with short but coapting lateral lobes  Bladder neck: open  Trigone: Normal  Trabeculation:1+  Diverticuli: No  Lesion: none    Lab / Imaging:   Results for orders placed or performed in visit on 07/14/14   AMB POC URINALYSIS DIP STICK AUTO W/O MICRO   Result Value Ref Range    Color (UA POC) Yellow     Clarity (UA POC) Clear     Glucose (UA POC) Negative Negative    Bilirubin (UA POC) Negative Negative    Ketones (UA POC) Negative Negative    Specific gravity (UA POC) 1.015 1.001 - 1.035    Blood (UA POC) Negative Negative    pH (UA POC) 8.0 4.6 - 8.0    Protein (UA POC) Negative Negative mg/dL    Urobilinogen (UA POC) 0.2 mg/dL 0.2 - 1    Nitrites (UA POC) Negative Negative    Leukocyte esterase (UA POC) Negative Negative   AMB POC PVR, MEAS,POST-VOID RES,US,NON-IMAGING   Result Value Ref Range    PVR 376 cc        Impression:  See office note     Plan:  See office note     Antibiotic provided: Cipro    Vance Gather, MD  Vance Gather, MD  Assistant Professor  [facility] Dept of Urology  [facility] [place] [place] Documentation is provided with the assistance of [facility], medical scribe for Vance Gather, MD on 07/14/2014.

## 2014-07-14 NOTE — Progress Notes (Signed)
ASSESSMENT:     ICD-10-CM ICD-9-CM    1. BPH (benign prostatic hypertrophy) N40.0 600.00 AMB POC URINALYSIS DIP STICK AUTO W/O MICRO      AMB POC PVR, MEAS,POST-VOID RES,US,NON-IMAGING      PR CYSTOURETHROSCOPY      REFERRAL TO URODYNAMICS ([facility])      URINE C&S   2. Urinary retention R33.9 788.20 AMB POC URINALYSIS DIP STICK AUTO W/O MICRO      AMB POC PVR, MEAS,POST-VOID RES,US,NON-IMAGING      PR CYSTOURETHROSCOPY      REFERRAL TO URODYNAMICS ([facility])      URINE C&S   3. 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    PVR remains high: 376cc today. RUS 07/2014 showed bilateral pelviectasis.  Although Cr is normal, I feel that his upper tracts are at risk.    I recommend consider repeat PVP. Will further evaluate with UDS prior to assess detrusor function. Pt to perform CIC in the interim for bladder management.     2. Bilateral renal cysts have been noted previously and are benign, asymptomatic.  Again noted on RUS 07/2014. No treatment or surveillance needed.    3. 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  Ultimate prognosis is not clear to me but in any event I think addressing his retention issues is worthwhile.    PLAN:    1. Cysto today, see separate office note  2. Discontinue Rapaflo and start Flomax 0.4mg , prescription provided today, SEs and dosing reviewed.   3. UDS to be scheduled   4. Refer to Dr. Maida Sale to review UDS and discuss PVP.   5. Start CIC 2x daily       Chief Complaint   Patient presents with   ??? Renal Cyst   ??? Benign Prostatic Hypertrophy       HISTORY OF PRESENT ILLNESS:  [name] is a 73 y.o. male presents for follow up BPH and urinary retention. Feels he is voiding very well. However his PVR's are still elevated. PVR today 376cc. No interim UTIs,  bladder pain, or hematuria. Denies flank pain. No incontinence or dysuria.     Currently on Rapaflo 8mg  daily, would like to discontinue and switch to Flomax 0.4mg  daily due to cost reasons. Has performed CIC in the past prior to PVP in 2011.     Has been diagnosed with diffuse large B-cell lymphoma and finished R-CHOP x 3 cycles with a good response. Recently followed up with his oncologist and reports "everything is clear."     AUA Symptom Score 07/14/2014   Over the past month how often have you had the sensation that your bladder was not completely empty after you finished urinating? 2   Over the past month, how often have had to urinate again less than 2 hours after you last finished urinating? 0   Over the past month, how often have you found you stopped and started again several times when you urinated? 2   Over the past month, how often have you found it difficult to postpone urination? 3   Over the past month, how often have you had a weak urinary stream? 0   Over the past month, how often have you had to push or strain to begin urinating? 2   Over the  past month, how many times did you most typically get up to urinate from the time you went to bed at night until the time you got up in the morning? 2   AUA Score 11   If you were to spend the rest of your life with your urinary condition the way it is now, how would you feel about that? -       Review of Systems  Constitutional: Fever: No;  Skin: Rash: No;  HEENT: Hearing difficulty: No;  Eyes: Blurred vision: No;  Cardiovascular: Chest pain: No;  Respiratory: Shortness of breath:   Gastrointestinal: Nausea/vomiting: No;  Musculoskeletal: Back pain: No;  Neurological: Weakness: No;  Psychological: Memory loss: No;  Comments/additional findings:            Past Medical History   Diagnosis Date   ??? Non Hodgkin's lymphoma ([place])    ??? Hypertrophy of prostate with urinary obstruction and other lower urinary tract symptoms (LUTS)     ??? Retention of urine, unspecified    ??? Renal cyst        Past Surgical History   Procedure Laterality Date   ??? Hx other surgical  1978-1979   ??? Hx hernia repair  1980s   ??? Pr laser vaporization surgery prostate, complete  08/2010       History   Substance Use Topics   ??? Smoking status: Never Smoker    ??? Smokeless tobacco: Not on file   ??? Alcohol Use: No       No Known Allergies    Family History   Problem Relation Age of Onset   ??? Cancer Brother      prostate   ??? Diabetes Brother    ??? Cancer Brother      prostate   ??? Diabetes Brother        Current Outpatient Prescriptions   Medication Sig Dispense Refill   ??? tamsulosin (FLOMAX) 0.4 mg capsule Take 1 Cap by mouth daily (after dinner). 30 Cap 11   ??? acetaminophen (TYLENOL) 325 mg tablet Take  by mouth every four (4) hours as needed.             PHYSICAL EXAMINATION:   BP 120/70 mmHg   Ht 5\' 10"  (1.778 m)   Wt 175 lb (79.379 kg)   BMI 25.11 kg/m2    GEN: NAD, alert and oriented  PULM: nl resp effort, no distress  ABD: benign, nontender  GU: penis normal   EXT: well perfused  PSYCH: Nl mood and affect    REVIEW OF LABS AND IMAGING:    Results for orders placed or performed in visit on 07/14/14   AMB POC URINALYSIS DIP STICK AUTO W/O MICRO   Result Value Ref Range    Color (UA POC) Yellow     Clarity (UA POC) Clear     Glucose (UA POC) Negative Negative    Bilirubin (UA POC) Negative Negative    Ketones (UA POC) Negative Negative    Specific gravity (UA POC) 1.015 1.001 - 1.035    Blood (UA POC) Negative Negative    pH (UA POC) 8.0 4.6 - 8.0    Protein (UA POC) Negative Negative mg/dL    Urobilinogen (UA POC) 0.2 mg/dL 0.2 - 1    Nitrites (UA POC) Negative Negative    Leukocyte esterase (UA POC) Negative Negative   AMB POC PVR, MEAS,POST-VOID RES,US,NON-IMAGING   Result Value Ref Range    PVR 376 cc  BMP 06/2014:  Glucose 84 65-99 mg/dL Final   BUN 26 8 - 27  mg/dL Final   Creatinine 0.98 0.76 - 1.27  mg/dL Final   GFR est non-AA 77 >59  mL/min/1.73 Final    GFR est AA 89 >59  mL/min/1.73 Final   BUN/Creatinine ratio 27 10 - 22   Final   Sodium 144 134 - 144  mmol/L Final   Potassium 5.2 3.5 - 5.2  mmol/L Final   Chloride 100 97 - 108  mmol/L Final   CO2 28 18 - 29  mmol/L Final   Calcium 9.5          CMP 02/24/13  CREATININE 1.0     PSA 02/24/13  2.7     RUS 07/06/14  Impression:  Bilateral pelviectasis without hydroureters.  Bilateral renal cysts.  Left - Large cyst with calcifications along rim noted lateral  kidney (8.92 x 10.4 x 8.35cm).  Simple cyst noted medial kidney (4.27 x 3.85 x 4.12cm).  Right- Large simple cyst noted superior kidney (7.82 x 7.40 x  8.33cm).   Simple cyst noted lateral kidney (4.27 x 4.75 x  6.33cm).  Bladder wall appeared irregular.   The bladder was markedly distended.   Pre void bladder volume = 511 cc's.   Post void bladder volume = 431 cc's.   There were bilateral urine jets noted with bladder.  Enlarged prostate at 63 grams.    No apparent obstructive uropathy on either side from multiple  large simple cysts.      PET CT 06/16/14 Skull Base to Mid Thighs:   Impression:  The study is stable with no change in the appearance or uptake involving the retroperitoneum.  No new or enlarging nodes. No significant abnormal FDG uptake.       Imaging Report Reviewed?  YES   Type:  RUS  Images Reviewed?      YES      Type:   RUS    Other Lab Data Reviewed?   YES  UA, BMP      Vance Gather, MD   Assistant Professor   [facility] Dept of Urology   [facility] [place] [place]    CC Alveta Heimlich, MD  CC: Dr Elnita Maxwell    Medical Documentation is provided with the assistance of [facility], medical scribe for Vance Gather, MD on 07/14/2014.

## 2014-07-16 LAB — URINE C&S

## 2014-07-16 NOTE — Progress Notes (Signed)
Quick Note:        Fwd to pt    ______

## 2014-07-28 ENCOUNTER — Ambulatory Visit: Admit: 2014-07-28 | Discharge: 2014-07-29 | Payer: MEDICARE | Primary: Internal Medicine

## 2014-07-28 DIAGNOSIS — R339 Retention of urine, unspecified: Secondary | ICD-10-CM

## 2014-07-28 NOTE — Progress Notes (Addendum)
[facility] [place] [facility]  Without Video- Fluoroscopy    Name: [name]   Date: 66/44/0347      [name] is a 73 y.o. male who presents today for evaluation. Patient has been referred by Dr. Norberto Sorenson.  Dr. Kizzie Fantasia is available in clinic as the incident to provider.        ICD-10-CM ICD-9-CM    1. Urinary retention R33.9 788.20 PR ELECTRO-UROFLOWMETRY, FIRST      PR ANAL/URINARY MUSCLE STUDY      PR VOIDING PRESS STUDY INTRA-ABDOMINAL VOID      PR COMPLEX CYSTOMETROGRAM VOIDING PRESSURE STUDIES     Orders Placed This Encounter   ??? ([id number]) - PR ELECTRO-UROFLOWMETRY, FIRST   ??? ([id number]) - PR ANAL/URINARY MUSCLE STUDY   ??? ([id number]) - PR VOIDING PRESS STUDY INTRA-ABDOMINAL VOID   ??? ([id number]) - COMPLEX CYSTOMETROGRAM VOIDING PRESSURE STUDIES       Past Medical History   Diagnosis Date   ??? Non Hodgkin's lymphoma ([place])    ??? Hypertrophy of prostate with urinary obstruction and other lower urinary tract symptoms (LUTS)    ??? Retention of urine, unspecified    ??? Renal cyst       Past Surgical History   Procedure Laterality Date   ??? Hx other surgical  1978-1979   ??? Hx hernia repair  1980s   ??? Pr laser vaporization surgery prostate, complete  08/2010           Upon arrival, the patient emptied His bladder using a calibrated uroflow machine to assess bladder volume and time of emptying.       The genital and perineal areas were cleansed with benzalkonium chloride and patient was catheterized with a 14 fr Coude catheter to assess PVR and absence of urinary tract infection.     A #7 fr Coude  urodynamics catheter was inserted into the bladder to infuse fluid and assess bladder activity and voiding parameters.  A #9 fr catheter was inserted into the rectum to assess straining during voiding. Both catheters were connected to water filled lines to transducers that simultaneously measure pressure generated inside and outside the bladder during filling and voiding as well as resulting flow volume and time.       Three patch electrodes are placed, one ground and two perianally which are used to measure the electrical activity of the external urinary sphincter during filling and emptying the bladder.      Infusion with sterile water was started and the patient was asked to voice sensations of pressure, urge and bladder fullness, followed by instructions to void.        Orders Placed This Encounter   ??? ([id number]) - PR ELECTRO-UROFLOWMETRY, FIRST   ??? ([id number]) - PR ANAL/URINARY MUSCLE STUDY   ??? ([id number]) - PR VOIDING PRESS STUDY INTRA-ABDOMINAL VOID   ??? ([id number]) - COMPLEX CYSTOMETROGRAM VOIDING PRESSURE STUDIES           Urine dipstick shows negative for all components.    Voiding Diary: 3 days    Intake:  Volume: 1530-2730 ml   Type:  Water     Output:  Voided Volume: 1425-2525 ml/24 hr Range: 75-275 ml Mean: 168   Frequency:    CIC x1/3 days- 500 ml X 10-13/24 hr       Leaking Episodes: no     Uroflometry  Voided Volume 2 ml                   Post  Void Residual 1000 ml- cath   Comments:     Cystometrogram  Sensation delayed   First sensation 697 ml   Strong desire 801 ml   Capacity 805 ml   Compliance: normal     Detrusor overactivity:yes At capacity    Leakage: no         Micturition  Voided Volume: 248 ml   Detrusor Pressure: At peak flow rate: 31 cm H2O Maximum: ? 40 cm H2O   Duration Contraction: 3 minutes  continuous    Post Void Contraction: no    Valsalva: partial   External Sphincter: slight increase in EMG activity   Jmichael Flow Rate: 5.9 ml/sec   Average Flow Rate 2.4 ml/sec   Voiding Time: 2:00 minutes   Flow Pattern: continuous   Post Void Residual: 700 ml - cath   Comments: He has a large capacity compliant bladder with delayed sensations. He had DO at capacity without leakage. He voided with a prolonged low pressure detrusor contraction, low flow, partial valsalva, slight increase in EMG activity and poor emptying.       The Patient Instruction Sheet was reviewed with and given to patient who  expressed understanding.      Revel Stellmach A Aleasha Fregeau      Follow-up Disposition:  Return in about 3 weeks (around 08/18/2014).         07/29/2014  UDS done with patient on Flomax.  he has a large capacity bladder (805 ml) with normal compliance (2 cm H2O at > 600 ml) and delayed sensation.  he had  DO  without leakage.  he did not demonstrate SUI with cough.  he did not have leak with valsalva.  40 cm of H2O.  he voided with moderate pressure detrusor contraction (31 cm H2O at Qmax) with a prolonged duration (2  minutes).   NO  Valsalva. Poor flow (Qmax 5.9 ml/sec) and continuous pattern,  With relaxed EUS and emptied incompletely (PVR 700 ML).    Suggestive of BOO with DO and UAB, compatible with chronic outlet obstruction and secondary bladder changes.  Vella Raring, MD  [facility]  A Division of [facility] [place]

## 2014-08-18 ENCOUNTER — Ambulatory Visit: Admit: 2014-08-18 | Discharge: 2014-08-18 | Payer: MEDICARE | Attending: Urology | Primary: Internal Medicine

## 2014-08-18 DIAGNOSIS — R339 Retention of urine, unspecified: Secondary | ICD-10-CM

## 2014-08-18 LAB — AMB POC PVR, MEAS,POST-VOID RES,US,NON-IMAGING: PVR: 341 cc

## 2014-08-18 NOTE — Progress Notes (Signed)
ASSESSMENT:     ICD-10-CM ICD-9-CM    1. Urinary retention R33.9 788.20 AMB POC PVR, MEAS,POST-VOID RES,US,NON-IMAGING   2. BPH with obstruction/lower urinary tract symptoms N40.1 600.01 AMB POC PVR, MEAS,POST-VOID RES,US,NON-IMAGING              PLAN:    ?? UDS reviewed - revealed a slightly weakened bladder  ?? Discussed etiologies as well as risks involved with incomplete bladder emptying    ?? Discussed repeat GreenLight PVP in detail including risks, benefits, and post-op symptoms  ?? Discussed PVP may not be successful due to weakened bladder described on UDS  ?? Patient will need Medical clearance   ?? Continue SIC x 3 every day   ?? Continue Flomax 0.4mg  every day          Chief Complaint   Patient presents with   ??? Urinary Retention     Pt is here to review UDS and discuss PVP.       HISTORY OF PRESENT ILLNESS:  [name] is a 73 y.o. male who presents today for BPH with obstruction and urinary retention. Patient is s/p PVP 08/2010 by Dr. Norberto Mcdaniel. His main complaint today is urinary retention. Patient currently SIC x 3 every day. Cystoscopy 07/14/14 revealed a mildly obstructing prostate with short but coapting lateral lobes. Patient is currently taking Flomax 0.4mg  every day with little benefit. PVR today was 341cc. Patient has history of large cell Lymphoma.        UDS 07/28/14:  Micturition  Voided Volume:  248 ml    Detrusor Pressure:  At peak flow rate: 31 cm H2O  Maximum: ? 40 cm H2O    Duration Contraction:  3 minutes continuous     Post Void Contraction:  no     Valsalva:  partial    External Sphincter:  slight increase in EMG activity    [name] Flow Rate:  5.9 ml/sec    Average Flow Rate  2.4 ml/sec    Voiding Time:  2:00 minutes    Flow Pattern:  continuous    Post Void Residual:  700 ml - cath    Comments: He has a large capacity compliant bladder with delayed sensations. He had DO at capacity without leakage. He voided with a  prolonged low pressure detrusor contraction, low flow, partial valsalva, slight increase in EMG activity and poor emptying.         AUA Symptom Score 08/18/2014   Over the past month how often have you had the sensation that your bladder was not completely empty after you finished urinating? 0   Over the past month, how often have had to urinate again less than 2 hours after you last finished urinating? 5   Over the past month, how often have you found you stopped and started again several times when you urinated? 4   Over the past month, how often have you found it difficult to postpone urination? 4   Over the past month, how often have you had a weak urinary stream? 3   Over the past month, how often have you had to push or strain to begin urinating? 4   Over the past month, how many times did you most typically get up to urinate from the time you went to bed at night until the time you got up in the morning? 2   AUA Score 22   If you were to spend the rest of your life with your urinary condition the  way it is now, how would you feel about that? Mostly dissatisfied       Past Medical History   Diagnosis Date   ??? Non Hodgkin's lymphoma ([place])    ??? Hypertrophy of prostate with urinary obstruction and other lower urinary tract symptoms (LUTS)    ??? Retention of urine, unspecified    ??? Renal cyst    ??? Bilateral hydronephrosis        Past Surgical History   Procedure Laterality Date   ??? Hx other surgical  1978-1979   ??? Hx hernia repair  1980s   ??? Pr laser vaporization surgery prostate, complete  08/2010       History   Substance Use Topics   ??? Smoking status: Never Smoker    ??? Smokeless tobacco: Not on file   ??? Alcohol Use: No       No Known Allergies    Family History   Problem Relation Age of Onset   ??? Cancer Brother      prostate   ??? Diabetes Brother    ??? Cancer Brother      prostate   ??? Diabetes Brother        Current Outpatient Prescriptions   Medication Sig Dispense Refill    ??? tamsulosin (FLOMAX) 0.4 mg capsule Take 1 Cap by mouth daily (after dinner). 30 Cap 11   ??? acetaminophen (TYLENOL) 325 mg tablet Take  by mouth every four (4) hours as needed.             Review of Systems  Constitutional: Fever: No  Skin: Rash: No  HEENT: Hearing difficulty: No  Eyes: Blurred vision: No  Cardiovascular: Chest pain: No  Respiratory: Shortness of breath: No  Gastrointestinal: Nausea/vomiting: No  Musculoskeletal: Back pain: No  Neurological: Weakness: No  Psychological: Memory loss: No  Comments/additional findings:       REVIEW OF LABS AND IMAGING:      Results for orders placed or performed in visit on 08/18/14   AMB POC PVR, MEAS,POST-VOID RES,US,NON-IMAGING   Result Value Ref Range    PVR 341 cc           A copy of today's office visit with all pertinent imaging results and labs were sent to the referring physician.        Januel Doolan A. Maida Sale, MD      24 minutes was spent reviewing the current situation with the patient in a face-to-face discussion.  Greater than 50% of the time was spent in consultation.         Documentation provided by [name], medical scribe for [name] A. Maida Sale, MD on 08/19/2014

## 2014-08-31 ENCOUNTER — Encounter: Attending: Urology | Primary: Internal Medicine

## 2014-10-19 ENCOUNTER — Ambulatory Visit: Admit: 2014-10-19 | Discharge: 2014-10-19 | Payer: MEDICARE | Attending: Urology | Primary: Internal Medicine

## 2014-10-19 DIAGNOSIS — N138 Other obstructive and reflux uropathy: Secondary | ICD-10-CM

## 2014-10-19 NOTE — Progress Notes (Signed)
ASSESSMENT:     ICD-10-CM ICD-9-CM    1. 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 Urinary retention R33.9 788.20 URINE C&S PRE-OP             PLAN:    ?? Patient is scheduled for GreenLight PVP 10/25/13  ?? Discussed risks, benefits, and post-op symptoms  ?? Advised patient he will be discharged with a catheter in place   ?? Patient is aware PVP may not be successful due to weakened bladder as noted on UDS  ?? Risks, benefits and option reviewed in great detail and agrees to proceed  ?? Patient did receive clearance from his Oncologist   ?? Urine for C&S   ?? Continue Flomax 0.4mg  every day       The patient has agreed to a PVP procedure. Options reviewed including but not limited - further medication, other surgical options, watchful waiting and others. Risks include but not limited to MI, DVT, PE, bleeding, infection and others. Side effects also reviewed include but not limited to retrograde ejaculation, ED, urine incontinence and others. All questions answered and patient agrees.        Chief Complaint   Patient presents with   ??? Pre-op Exam   ??? Benign Prostatic Hypertrophy   ??? Urinary Retention       HISTORY OF PRESENT ILLNESS:  [name] is a 74 y.o. male who presents today for BPH with obstruction and urinary retention. Patient is s/p PVP 08/2010 by Dr. Norberto Sorenson. Patient currently SIC x 2 every day. He is able to void small amounts but has to use SIC to completely empty his bladder. Cystoscopy 07/14/14 revealed a mildly obstructing prostate with short but coapting lateral lobes. Patient is currently taking Flomax 0.4mg  every day with little benefit. Patient has history of large cell Lymphoma.????PVR from 300 - 450 cc per patient  ??     UDS 07/28/14:  Micturition  Voided Volume: ?? 248 ml ??   Detrusor Pressure: ?? At peak flow rate: 31 cm H2O ?? Maximum: ? 40 cm H2O ??   Duration Contraction: ?? 3 minutes continuous ?? ??   Post Void Contraction: ?? no ?? ??    Valsalva: ?? partial ??   External Sphincter: ?? slight increase in EMG activity ??   [name]: ?? 5.9 ml/sec ??   Average Flow Rate ?? 2.4 ml/sec ??   Voiding Time: ?? 2:00 minutes ??   Flow Pattern: ?? continuous ??   Post Void Residual: ?? 700 ml - cath ??   Comments: He has a large capacity compliant bladder with delayed sensations. He had DO at capacity without leakage. He voided with a prolonged low pressure detrusor contraction, low flow, partial valsalva, slight increase in EMG activity and poor emptying.??              AUA Symptom Score 10/19/2014   Over the past month how often have you had the sensation that your bladder was not completely empty after you finished urinating? 0   Over the past month, how often have had to urinate again less than 2 hours after you last finished urinating? 5   Over the past month, how often have you found you stopped and started again several times when you urinated? 5   Over the past month, how often have you found it difficult to postpone urination? 5   Over the past month, how often have you had a  weak urinary stream? 2   Over the past month, how often have you had to push or strain to begin urinating? 3   Over the past month, how many times did you most typically get up to urinate from the time you went to bed at night until the time you got up in the morning? 2   AUA Score 22   If you were to spend the rest of your life with your urinary condition the way it is now, how would you feel about that? Mostly dissatisfied       Past Medical History   Diagnosis Date   ??? Non Hodgkin's lymphoma ([place])    ??? Hypertrophy of prostate with urinary obstruction and other lower urinary tract symptoms (LUTS)    ??? Retention of urine, unspecified    ??? Renal cyst    ??? Bilateral hydronephrosis        Past Surgical History   Procedure Laterality Date   ??? Hx other surgical  1978-1979   ??? Hx hernia repair  1980s   ??? Pr laser vaporization surgery prostate, complete  08/2010       History    Substance Use Topics   ??? Smoking status: Never Smoker    ??? Smokeless tobacco: Not on file   ??? Alcohol Use: No       No Known Allergies    Family History   Problem Relation Age of Onset   ??? Cancer Brother      prostate   ??? Diabetes Brother    ??? Cancer Brother      prostate   ??? Diabetes Brother          BP 130/80 mmHg   Ht 5\' 10"  (1.778 m)   Wt 175 lb (79.379 kg)   BMI 25.11 kg/m2  Constitutional: Well developed, well-nourished male in no acute distress.   CV:  No peripheral swelling noted  Respiratory: No respiratory distress or difficulties  Abdomen:  No pain  GU:  No CVA tenderness.   Skin: Normal cyanosis, no jaundice    Neuro/Psych:  Patient with appropriate affect.  Alert and oriented x 4              Review of Systems  Constitutional: Fever: No  Skin: Rash: No  HEENT: Hearing difficulty: No  Eyes: Blurred vision: No  Cardiovascular: Chest pain: No  Respiratory: Shortness of breath: No  Gastrointestinal: Nausea/vomiting: No  Musculoskeletal: Back pain: No  Neurological: Weakness: No  Psychological: Memory loss: No  Comments/additional findings:           A copy of today's office visit with all pertinent imaging results and labs were sent to the referring physician.        Sharley Keeler A. Maida Sale, MD      24 minutes was spent reviewing the current situation with the patient in a face-to-face discussion.  Greater than 50% of the time was spent in consultation.         Documentation provided by Carie Caddy, medical scribe for Dellar Traber A. Maida Sale, MD on 10/19/2014

## 2014-10-21 LAB — URINE C&S PRE-OP

## 2014-10-22 MED ORDER — CIPROFLOXACIN 500 MG TAB
500 mg | ORAL_TABLET | Freq: Two times a day (BID) | ORAL | Status: AC
Start: 2014-10-22 — End: 2014-10-29

## 2014-10-22 NOTE — Telephone Encounter (Signed)
Per BAR, patient was informed of his pre-op urine culture results and was told that a prescription for Cipro 500mg  po bid x 7 days have been sent to his local pharmacy.     The patient stated that he understood and the call was ended.

## 2014-10-26 NOTE — Progress Notes (Signed)
Patient is post op, has f/u on 11/16/14 with Dr.Rosenfeld, was advised to come in on Wednesday for cath removal - transferred call to [facility].

## 2014-10-27 ENCOUNTER — Ambulatory Visit: Admit: 2014-10-27 | Discharge: 2014-10-27 | Payer: MEDICARE | Attending: Urology | Primary: Internal Medicine

## 2014-10-27 DIAGNOSIS — N401 Enlarged prostate with lower urinary tract symptoms: Secondary | ICD-10-CM

## 2014-10-27 NOTE — Progress Notes (Signed)
ASSESSMENT:   ?? S/p PVP   ?? Foley catheter removed  ?? Overall doing well          PLAN:    ?? Drink large amounts of fluid  ?? Will call with voiding status  ?? Patient will use CIC if he is unable to void   ?? Discussed increased frequency, incontinence, urgency, gross hematuria, or dysuria are common post-op symptoms  ?? RTO 4-6 weeks         Chief Complaint   Patient presents with   ??? Urinary Retention       HISTORY OF PRESENT ILLNESS:  [name] is seen in follow up from a PVP laser of the prostate. Foley catheter was removed. Denies any further gross hematuria since the procedure. He is to drink a lot of fluid and let us know how he is voiding today. If there are problems, he will resume SIC.  All questions were answered and we reviewed expected course.     Overall he is doing very well and we have gone over all issues.         [name] A. Maida Sale, MD         Documentation provided by Carie Caddy, medical scribe for Joleah Kosak A. Maida Sale, MD

## 2014-11-05 NOTE — Progress Notes (Signed)
Per Dr. Maida Sale Pt is CIC four time a day.  Caelum Federici L. Lien Lyman, LPN

## 2014-11-16 ENCOUNTER — Ambulatory Visit: Admit: 2014-11-16 | Discharge: 2014-11-16 | Payer: MEDICARE | Attending: Urology | Primary: Internal Medicine

## 2014-11-16 DIAGNOSIS — N401 Enlarged prostate with lower urinary tract symptoms: Secondary | ICD-10-CM

## 2014-11-16 NOTE — Progress Notes (Signed)
ASSESSMENT:     ICD-10-CM ICD-9-CM    1. Benign non-nodular prostatic hyperplasia with lower urinary tract symptoms N40.1 600.91    2. Urinary retention R33.9 788.20               PLAN:    ?? Discussed increased urgency, gross hematuria and dysuria are common post-op symptoms  ?? Continue CIC BID  ?? Discussed swelling is still present from the procedure and symptoms should improve - will see if urine retention resolves  ?? Reviewed at great length pre-op that this may not resolve  ?? RTO in 3 months          Chief Complaint   Patient presents with   ??? Post OP Follow Up       HISTORY OF PRESENT ILLNESS:  [name] is a 74 y.o. male who presents today for BPH and urinary retention. Patient is s/p GreenLight PVP 10/25/14. He is no longer taking any GU medications. He is able to void freely. However, patient still uses CIC x2 and gets 450-600cc with each catheterization. He still notes some dysuria while voiding. Patient has history of large cell Lymphoma.      AUA Symptom Score 11/16/2014   Over the past month how often have you had the sensation that your bladder was not completely empty after you finished urinating? 5   Over the past month, how often have had to urinate again less than 2 hours after you last finished urinating? 5   Over the past month, how often have you found you stopped and started again several times when you urinated? 5   Over the past month, how often have you found it difficult to postpone urination? 4   Over the past month, how often have you had a weak urinary stream? 3   Over the past month, how often have you had to push or strain to begin urinating? 4   Over the past month, how many times did you most typically get up to urinate from the time you went to bed at night until the time you got up in the morning? 3   AUA Score 29   If you were to spend the rest of your life with your urinary condition the way it is now, how would you feel about that? Mixed-about equally satisfied        Past Medical History   Diagnosis Date   ??? Non Hodgkin's lymphoma ([place])    ??? BPH with obstruction/lower urinary tract symptoms    ??? Retention of urine, unspecified    ??? Renal cyst    ??? Bilateral hydronephrosis        Past Surgical History   Procedure Laterality Date   ??? Hx other surgical  1978-1979   ??? Hx hernia repair  1980s   ??? Hx urological  05/02/2010     PNBx-TRUS Vol 62 cc's, Benign, pre-Bx PSA 4.8, Dr.Malcolm   ??? Pr laser vaporization surgery prostate, complete  08/2010   ??? Hx urological  10/25/2014     [facility], PVP, Dr. Maida Sale       History   Substance Use Topics   ??? Smoking status: Never Smoker    ??? Smokeless tobacco: Not on file   ??? Alcohol Use: No       No Known Allergies    Family History   Problem Relation Age of Onset   ??? Cancer Brother      prostate   ??? Diabetes Brother    ???  Cancer Brother      prostate   ??? Diabetes Brother              Review of Systems  Constitutional: Fever: No  Skin: Rash: No  HEENT: Hearing difficulty: No  Eyes: Blurred vision: No  Cardiovascular: Chest pain: No  Respiratory: Shortness of breath: No  Gastrointestinal: Nausea/vomiting: No  Musculoskeletal: Back pain: No  Neurological: Weakness: No  Psychological: Memory loss: No  Comments/additional findings:       REVIEW OF LABS AND IMAGING:      Results for orders placed or performed in visit on 10/19/14   URINE C&S PRE-OP   Result Value Ref Range    FINAL REPORT Microbiology results (A)            A copy of today's office visit with all pertinent imaging results and labs were sent to the referring physician.        Sherman Donaldson A. Maida Sale, MD            Documentation provided by Carie Caddy, medical scribe for Elanah Osmanovic A. Maida Sale, MD

## 2014-11-18 ENCOUNTER — Encounter: Attending: Urology | Primary: Internal Medicine

## 2015-02-15 ENCOUNTER — Ambulatory Visit: Admit: 2015-02-15 | Discharge: 2015-02-15 | Payer: MEDICARE | Attending: Urology | Primary: Internal Medicine

## 2015-02-15 DIAGNOSIS — N401 Enlarged prostate with lower urinary tract symptoms: Secondary | ICD-10-CM

## 2015-02-15 MED ORDER — CIPROFLOXACIN 500 MG TAB
500 mg | ORAL_TABLET | Freq: Two times a day (BID) | ORAL | Status: AC
Start: 2015-02-15 — End: 2015-02-22

## 2015-02-15 NOTE — Progress Notes (Signed)
ASSESSMENT:     ICD-10-CM ICD-9-CM    1. Benign non-nodular prostatic hyperplasia with lower urinary tract symptoms N40.1 600.91    2. Acute UTI N39.0 599.0 URINE C&S   3. 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 Patient experiencing UTI symptoms  ?? Discussed patient is more prone to bacterial colonization due to catheterization   ?? Urine C&S   ?? Script for Cipro 500mg  BID for 7 days   ?? Will call with results of urine culture - may discontinue or change antibiotic course pending results  ?? RTO in 6 months to reassess symptoms           Chief Complaint   Patient presents with   ??? Urinary Retention     Pt is here for follow up appointment.   ??? UTI     Pt is not currently taking ABX.       HISTORY OF PRESENT ILLNESS:  [name] is a 74 y.o. male who presents today for BPH and history of urinary retention. Patient is s/p GreenLight PVP 10/25/14. He is no longer taking any GU medications. He is able to void freely but still uses CIC x2 daily and expels 450-600cc of urine with each catheterization (was practicing CIC x 4 prior to PVP). Denies any issues with CIC. However, Patient now complains of urinary urgency, frequency, and dysuria. Onset of symptoms was a week ago and he believes he has an infection. 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           AUA Symptom Score 02/15/2015   Over the past month how often have you had the sensation that your bladder was not completely empty after you finished urinating? 5   Over the past month, how often have had to urinate again less than 2 hours after you last finished urinating? 5   Over the past month, how often have you found you stopped and started again several times when you urinated? 5   Over the past month, how often have you found it difficult to postpone urination? 3    Over the past month, how often have you had a weak urinary stream? 5   Over the past month, how often have you had to push or strain to begin urinating? 5   Over the past month, how many times did you most typically get up to urinate from the time you went to bed at night until the time you got up in the morning? 3   AUA Score 31   If you were to spend the rest of your life with your urinary condition the way it is now, how would you feel about that? Mixed-about equally satisfied       Past Medical History   Diagnosis Date   ??? Non Hodgkin's lymphoma ([place])    ??? BPH with obstruction/lower urinary tract symptoms    ??? Retention of urine, unspecified    ??? Renal cyst    ??? Bilateral hydronephrosis        Past Surgical History   Procedure Laterality Date   ??? Hx other surgical  1978-1979   ??? Hx hernia repair  1980s   ??? Hx urological  05/02/2010     PNBx-TRUS Vol 62 cc's, Benign, pre-Bx PSA 4.8, Dr.Malcolm   ???  Pr laser vaporization surgery prostate, complete  08/2010   ??? Hx urological  10/25/2014     [facility], PVP, Dr. Maida Sale       History   Substance Use Topics   ??? Smoking status: Never Smoker    ??? Smokeless tobacco: Not on file   ??? Alcohol Use: No       No Known Allergies    Family History   Problem Relation Age of Onset   ??? Cancer Brother      prostate   ??? Diabetes Brother    ??? Cancer Brother      prostate   ??? Diabetes Brother        Current Outpatient Prescriptions   Medication Sig Dispense Refill   ??? coenzyme q10-vitamin e 100-100 mg-unit cap Take  by mouth.     ??? cholecalciferol (VITAMIN D3) 1,000 unit cap Take  by mouth daily.     ??? ciprofloxacin HCl (CIPRO) 500 mg tablet Take 1 Tab by mouth two (2) times a day for 7 days. 14 Tab 0         Review of Systems  Constitutional: Fever: No  Skin: Rash: No  HEENT: Hearing difficulty: No  Eyes: Blurred vision: No  Cardiovascular: Chest pain: No  Respiratory: Shortness of breath: No  Gastrointestinal: Nausea/vomiting: No  Musculoskeletal: Back pain: No   Neurological: Weakness: No  Psychological: Memory loss: No  Comments/additional findings:       REVIEW OF LABS AND IMAGING:            A copy of today's office visit with all pertinent imaging results and labs were sent to the referring physician.        Avish Torry A. Maida Sale, MD            Documentation provided by Carie Caddy, medical scribe for Leslie Langille A. Maida Sale, MD

## 2015-02-17 LAB — URINE C&S

## 2015-02-18 NOTE — Progress Notes (Signed)
Quick Note:        On Cipro.    ______

## 2015-02-18 NOTE — Telephone Encounter (Signed)
Patient calling regarding the Urine Culture results.  Patient advise a message would be sent to staff for review and someone shuld get back to him before the close of business.[email or web address],ma

## 2015-02-21 NOTE — Telephone Encounter (Signed)
Left a message for pt to return my call   [name]

## 2015-02-21 NOTE — Telephone Encounter (Signed)
The patient returned call to the POC, and was placed on hold as I tried to connect him, but was unsuccessful.  Returned back to the patient, to advise him that I would have the nurse return his call.  He agreed, thanked me and call was ended.  The patient can be reached at [phone].

## 2015-02-21 NOTE — Telephone Encounter (Signed)
Spoke with pt and informed him he should just complete the cipro after looking at his urine culture per Dr Maida Sale  Lucilla Lame

## 2015-05-10 ENCOUNTER — Encounter

## 2015-05-11 ENCOUNTER — Inpatient Hospital Stay: Admit: 2015-05-11 | Payer: MEDICARE | Attending: Hematology & Oncology | Primary: Internal Medicine

## 2015-05-11 DIAGNOSIS — C8588 Other specified types of non-Hodgkin lymphoma, lymph nodes of multiple sites: Secondary | ICD-10-CM

## 2015-05-11 LAB — POC PT/INR
INR: 1 (ref 0.0–1.1)
Prothrombin time: 11.5 seconds (ref 0.0–14.0)

## 2015-05-11 MED ORDER — FLUMAZENIL 0.1 MG/ML IV SOLN
0.1 mg/mL | INTRAVENOUS | Status: DC | PRN
Start: 2015-05-11 — End: 2015-05-11

## 2015-05-11 MED ORDER — NALOXONE 0.4 MG/ML INJECTION
0.4 mg/mL | Freq: Once | INTRAMUSCULAR | Status: DC
Start: 2015-05-11 — End: 2015-05-11

## 2015-05-11 MED ORDER — CEFAZOLIN 1 GRAM SOLUTION FOR INJECTION
1 gram | INTRAMUSCULAR | Status: AC
Start: 2015-05-11 — End: 2015-05-11
  Administered 2015-05-11: 17:00:00

## 2015-05-11 MED ORDER — BUPIVACAINE-EPINEPHRINE (PF) 0.5 %-1:200,000 IJ SOLN
0.5 %-1:200,000 | INTRAMUSCULAR | Status: DC | PRN
Start: 2015-05-11 — End: 2015-05-11
  Administered 2015-05-11: 17:00:00 via SUBCUTANEOUS

## 2015-05-11 MED ORDER — CEFAZOLIN 1 GRAM IV SOLUTION
1 gram | Freq: Once | INTRAVENOUS | Status: DC
Start: 2015-05-11 — End: 2015-05-11
  Administered 2015-05-11: 16:00:00

## 2015-05-11 MED ORDER — MIDAZOLAM 1 MG/ML IJ SOLN
1 mg/mL | INTRAMUSCULAR | Status: DC | PRN
Start: 2015-05-11 — End: 2015-05-11
  Administered 2015-05-11 (×3): via INTRAVENOUS

## 2015-05-11 MED ORDER — HEPARIN (PORCINE) IN NS (PF) 1,000 UNIT/500 ML IV
1000 unit/500 mL | Freq: Once | INTRAVENOUS | Status: DC
Start: 2015-05-11 — End: 2015-05-11

## 2015-05-11 MED ORDER — GUM MASTIC-STORAX-METHYLSALICYLATE-ALCOHOL IN A DROPPERETTE
Status: DC | PRN
Start: 2015-05-11 — End: 2015-05-11
  Administered 2015-05-11: 17:00:00 via TOPICAL

## 2015-05-11 MED ORDER — SODIUM CHLORIDE 0.9 % IV
INTRAVENOUS | Status: DC
Start: 2015-05-11 — End: 2015-05-11
  Administered 2015-05-11: 15:00:00 via INTRAVENOUS

## 2015-05-11 MED ORDER — FENTANYL CITRATE (PF) 50 MCG/ML IJ SOLN
50 mcg/mL | INTRAMUSCULAR | Status: DC | PRN
Start: 2015-05-11 — End: 2015-05-11
  Administered 2015-05-11: 17:00:00 via INTRAVENOUS

## 2015-05-11 MED FILL — BUPIVACAINE-EPINEPHRINE (PF) 0.5 %-1:200,000 IJ SOLN: 0.5 %-1:200,000 | INTRAMUSCULAR | Qty: 30

## 2015-05-11 MED FILL — SODIUM CHLORIDE 0.9 % IV: INTRAVENOUS | Qty: 1000

## 2015-05-11 MED FILL — CEFAZOLIN 1 GRAM IV SOLUTION: 1 gram | INTRAVENOUS | Qty: 1000

## 2015-05-11 MED FILL — FENTANYL CITRATE (PF) 50 MCG/ML IJ SOLN: 50 mcg/mL | INTRAMUSCULAR | Qty: 4

## 2015-05-11 MED FILL — HEPARIN (PORCINE) IN NS (PF) 1,000 UNIT/500 ML IV: 1000 unit/500 mL | INTRAVENOUS | Qty: 500

## 2015-05-11 MED FILL — CEFAZOLIN 1 GRAM SOLUTION FOR INJECTION: 1 gram | INTRAMUSCULAR | Qty: 1000

## 2015-05-11 MED FILL — GUM MASTIC-STORAX-METHYLSALICYLATE-ALCOHOL IN A DROPPERETTE: Qty: 1

## 2015-05-11 MED FILL — MIDAZOLAM 1 MG/ML IJ SOLN: 1 mg/mL | INTRAMUSCULAR | Qty: 5

## 2015-05-11 NOTE — H&P (Signed)
BRIEF HISTORY AND PHYSICAL      Clinical History:   Patient for Mediport removal.    HPI:  Patient with lymphoma in remission for Mediport removal    Allergies:      Allergies   Allergen Reactions   ??? Ambien [Zolpidem] Other (comments)     confusion   ??? Ativan [Lorazepam] Other (comments)     Confusion   ??? Benadryl [Diphenhydramine Hcl] Other (comments)     Tremors and shaking       Current Meds:       No current facility-administered medications on file prior to encounter.     Current Outpatient Prescriptions on File Prior to Encounter   Medication Sig Dispense Refill   ??? coenzyme q10-vitamin e 100-100 mg-unit cap Take  by mouth.             Comorbid Conditions:    Past Medical History   Diagnosis Date   ??? Non Hodgkin's lymphoma ([place])    ??? BPH with obstruction/lower urinary tract symptoms    ??? Retention of urine, unspecified    ??? Renal cyst    ??? Bilateral hydronephrosis          Data:    BP 125/66 mmHg   Pulse 68   Temp(Src) 97.6 ??F (36.4 ??C)   Resp 15   Ht 5\' 9"  (1.753 m)   Wt 72.122 kg (159 lb)   BMI 23.47 kg/m2   SpO2 96%:    Lab Results   Component Value Date    PTP 11.5 05/11/2015    INR 1.0 05/11/2015       CBC w/Diff    Lab Results   Component Value Date/Time    WBC 7.1 07/23/2008 11:32 AM    RBC 3.92* 07/23/2008 11:32 AM    HGB 9.1* 07/23/2008 11:32 AM    HCT 29.1* 07/23/2008 11:32 AM    MCV 74.2* 07/23/2008 11:32 AM    MCH 23.2* 07/23/2008 11:32 AM    MCHC 31.3 07/23/2008 11:32 AM    RDW 20.7* 07/23/2008 11:32 AM    PLT 384 07/23/2008 11:32 AM        Lab Results   Component Value Date/Time    GRANS 80* 07/23/2008 11:32 AM    LYMPH 9* 07/23/2008 11:32 AM    MONOS 11* 07/23/2008 11:32 AM    EOS 0 07/23/2008 11:32 AM    BASOS 0 07/23/2008 11:32 AM      Basic Metabolic Profile   Lab Results   Component Value Date    NA 144 06/09/2014    K 5.2 06/09/2014    CL 100 06/09/2014    CO2 28 06/09/2014    BUN 26 06/09/2014    CREA 0.98 06/09/2014    GLU 84 06/09/2014    CA 9.5 06/09/2014        Coagulation    Lab Results   Component Value Date/Time    PTP 11.5 05/11/2015 11:26 AM    INR 1.0 05/11/2015 11:26 AM         No results found for: [place], [id number], [id number], [id number], PREGU, POCHCG, MHCGN, HCGQR, THCGA1, SHCG, HCGN, HCGSERUM, HCGURQLPOC     Physical Exam:     General Appearance:   Appears in no acute distress.     Lungs:  Clear     Heart:  Regular rate and rhythm     Abdomen:  Soft, non-distended, normal bowel sounds    Site:  No redness, swelling  Impression:  Lymphoma in remission for Mediport removal    Plan:  NPO  Consent  PT/INR  IV Access      Barbaraann Boys, MD  May 11, 2015  12:15 PM  Vascular & Interventional Radiology

## 2015-05-11 NOTE — Progress Notes (Signed)
Vascular & Interventional Radiology Brief Procedure Note    Interventional Radiologist: Barbaraann Boys, MD    Pre-operative Diagnosis: Mediport Removal    Post-operative Diagnosis: Same as pre-op dx    Procedure(s) Performed:    Chest Wall Mediport Removal    Anesthesia:    Local and Moderate Sedation    Findings:    Under blunt Mediport removed    Complications:   None    Estimated Blood Loss:    minimal    Disposition:   Return to Cath Holding for post procedure monitoring.    Plan:   Observe x 30 - 60 minutes then d/c home.  F/U radiology 10 - 14 days    Barbaraann Boys, MD  May 11, 2015  12:53 PM

## 2015-05-11 NOTE — Other (Signed)
Pt to cath holding and report to pete using s bar kardex and mar

## 2015-11-12 MED ORDER — CIPROFLOXACIN 500 MG TAB
500 mg | ORAL_TABLET | Freq: Two times a day (BID) | ORAL | 0 refills | Status: AC
Start: 2015-11-12 — End: 2015-11-22

## 2015-11-12 NOTE — Telephone Encounter (Signed)
Patient with UTI, usually gets this 1-2 times per year.  Cipro usually takes care of it.  Not in posiition to get a urine culture.  Will order cipro and ask him to FU with Dr. Maida Sale if he does not improve.

## 2015-11-24 ENCOUNTER — Ambulatory Visit: Admit: 2015-11-24 | Discharge: 2015-11-24 | Payer: MEDICARE | Attending: Urology | Primary: Internal Medicine

## 2015-11-24 DIAGNOSIS — N390 Urinary tract infection, site not specified: Secondary | ICD-10-CM | POA: Diagnosis not present

## 2015-11-24 DIAGNOSIS — R339 Retention of urine, unspecified: Secondary | ICD-10-CM | POA: Diagnosis not present

## 2015-11-24 DIAGNOSIS — N401 Enlarged prostate with lower urinary tract symptoms: Secondary | ICD-10-CM | POA: Diagnosis not present

## 2015-11-24 NOTE — Progress Notes (Signed)
ASSESSMENT:     ICD-10-CM ICD-9-CM    1. Urinary retention R33.9 788.20    2. Benign non-nodular prostatic hyperplasia with lower urinary tract symptoms N40.1 600.91    3. Urinary tract infection, site unspecified N39.0               PLAN:    ?? Patient now doing well  ?? Continue CIC x 2 daily - he is well educated on how to practice clean technique   ?? Recommend increasing CIC to x3 daily if he starts to void less freely and catheterization amounts exceed 600cc  ?? RTO in 1 year to reassess symptoms or sooner if symptoms worsen           Chief Complaint   Patient presents with   ??? UTI     F/U    ??? Benign Prostatic Hypertrophy       HISTORY OF PRESENT ILLNESS:  [name] is a 75 y.o. male who presents today for BPH and urinary retention. Patient is s/p GreenLight PVP 10/25/14. He is no longer taking any GU medications. He is able to void ~200cc freely but still uses CIC x2 daily and expels 500-600cc of urine with each catheterization (was practicing CIC x 4 prior to PVP). Denies any issues with CIC. However, patient recently started to experience UTI symptoms (dysuria) and was provided a 10 day course of Cipro 500mg  BID by Dr. Wynona Luna which resolved his symptoms. 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           Past Medical History:   Diagnosis Date   ??? Acute UTI    ??? Atonic bladder    ??? Benign non-nodular prostatic hyperplasia with lower urinary tract symptoms    ??? Bilateral hydronephrosis    ??? BPH with obstruction/lower urinary tract symptoms    ??? Non Hodgkin's lymphoma ([place])    ??? Renal cyst    ??? Retention of urine, unspecified        Past Surgical History:   Procedure Laterality Date   ??? HX HERNIA REPAIR  1980s   ??? HX OTHER SURGICAL  1978-1979   ??? HX UROLOGICAL  05/02/2010    PNBx-TRUS Vol 62 cc's, Benign, pre-Bx PSA 4.8, Dr.Malcolm   ??? HX UROLOGICAL  10/25/2014     [facility], PVP, Dr. Maida Sale   ??? LASER VAPORIZATION SURGERY PROSTATE, COMPLETE  08/2010       Social History   Substance Use Topics   ??? Smoking status: Never Smoker   ??? Smokeless tobacco: None   ??? Alcohol use No       Allergies   Allergen Reactions   ??? Ambien [Zolpidem] Other (comments)     confusion   ??? Ativan [Lorazepam] Other (comments)     Confusion   ??? Benadryl [Diphenhydramine Hcl] Other (comments)     Tremors and shaking       Family History   Problem Relation Age of Onset   ??? Cancer Brother      prostate   ??? Diabetes Brother    ??? Cancer Brother      prostate   ??? Diabetes Brother        Current Outpatient Prescriptions   Medication Sig Dispense Refill   ??? cholecalciferol, vitamin D3, 2,000 unit tab Take 2,000 Units by mouth daily.     ??? coenzyme q10-vitamin e 100-100  mg-unit cap Take  by mouth.           Review of Systems  Constitutional: Fever: No  Skin: Rash: No  HEENT: Hearing difficulty: Yes  Eyes: Blurred vision: No  Cardiovascular: Chest pain: No  Respiratory: Shortness of breath: No  Gastrointestinal: Nausea/vomiting:   Musculoskeletal: Back pain: No  Neurological: Weakness: No  Psychological: Memory loss: No  Comments/additional findings:       REVIEW OF LABS AND IMAGING:      Results for orders placed or performed during the hospital encounter of 05/11/15   POC PT/INR   Result Value Ref Range    Prothrombin time 11.5 0.0 - 14.0 seconds    INR 1.0 0.0 - 1.1             A copy of today's office visit with all pertinent imaging results and labs were sent to the referring physician.        Doyle Askew, MD          Documentation provided by Carie Caddy, medical scribe for Doyle Askew, MD

## 2015-12-29 ENCOUNTER — Ambulatory Visit: Admit: 2015-12-29 | Discharge: 2015-12-29 | Payer: MEDICARE | Attending: Urology | Primary: Internal Medicine

## 2015-12-29 DIAGNOSIS — N390 Urinary tract infection, site not specified: Secondary | ICD-10-CM | POA: Diagnosis not present

## 2015-12-29 DIAGNOSIS — R300 Dysuria: Secondary | ICD-10-CM | POA: Diagnosis not present

## 2015-12-29 DIAGNOSIS — N401 Enlarged prostate with lower urinary tract symptoms: Secondary | ICD-10-CM | POA: Diagnosis not present

## 2015-12-29 LAB — AMB POC URINALYSIS DIP STICK AUTO W/O MICRO
Bilirubin (UA POC): NEGATIVE
Glucose (UA POC): NEGATIVE
Ketones (UA POC): NEGATIVE
Nitrites (UA POC): POSITIVE
Specific gravity (UA POC): 1.015 (ref 1.001–1.035)
Urobilinogen (UA POC): 0.2 (ref 0.2–1)
pH (UA POC): 7.5 (ref 4.6–8.0)

## 2015-12-29 MED ORDER — TRIMETHOPRIM-SULFAMETHOXAZOLE 160 MG-800 MG TAB
160-800 mg | ORAL_TABLET | Freq: Two times a day (BID) | ORAL | 0 refills | Status: AC
Start: 2015-12-29 — End: 2016-01-05

## 2015-12-29 NOTE — Progress Notes (Signed)
ASSESSMENT:     ICD-10-CM ICD-9-CM    1. Dysuria R30.0 788.1    2. Benign non-nodular prostatic hyperplasia with lower urinary tract symptoms N40.1 600.91    3. Recurrent UTI N39.0 599.0 AMB POC URINALYSIS DIP STICK AUTO W/O MICRO      URINE C&S              PLAN:    ?? Discussed etiologies of dysuria and UTI in men who practice CIC  ?? Recommend antiseptic towelettes to wipe off the glans penis prior to practicing CIC  ?? Recommend using hand sanitizer (on his hands) prior to practicing CIC  ?? Urine for C&S - Will notify with results   ?? Will empirically treat with a course of Septra DS BID for 7 days  ?? Will call patient with results of culture and discuss further treatment plan          Chief Complaint   Patient presents with   ??? UTI       HISTORY OF PRESENT ILLNESS:  [name] is a 75 y.o. male who presents today for a new onset of dysuria and a history of BPH, urinary retention, and recurrent UTI. Patient is s/p GreenLight PVP 10/25/14. He is no longer taking any GU medications. He is able to void 100-200cc freely but still uses CIC x2 daily and expels 500-600cc of urine with each catheterization (was practicing CIC x 4 prior to PVP). Denies any issues with CIC. However, he does have a history of recurrent UTI and he reports a new onset of dysuria. UA today showed 3+ Leuks and positive nitrites. 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          AUA Symptom Score 02/15/2015   Over the past month how often have you had the sensation that your bladder was not completely empty after you finished urinating? 5   Over the past month, how often have had to urinate again less than 2 hours after you last finished urinating? 5   Over the past month, how often have you found you stopped and started again several times when you urinated? 5    Over the past month, how often have you found it difficult to postpone urination? 3   Over the past month, how often have you had a weak urinary stream? 5   Over the past month, how often have you had to push or strain to begin urinating? 5   Over the past month, how many times did you most typically get up to urinate from the time you went to bed at night until the time you got up in the morning? 3   AUA Score 31   If you were to spend the rest of your life with your urinary condition the way it is now, how would you feel about that? Mixed-about equally satisfied       Past Medical History:   Diagnosis Date   ??? Acute UTI    ??? Atonic bladder    ??? Benign non-nodular prostatic hyperplasia with lower urinary tract symptoms    ??? Bilateral hydronephrosis    ??? BPH with obstruction/lower urinary tract symptoms    ??? Non Hodgkin's lymphoma ([place])    ??? Renal cyst    ??? Retention of urine, unspecified        Past Surgical History:   Procedure  Laterality Date   ??? HX HERNIA REPAIR  1980s   ??? HX OTHER SURGICAL  1978-1979   ??? HX UROLOGICAL  05/02/2010    PNBx-TRUS Vol 62 cc's, Benign, pre-Bx PSA 4.8, Dr.Malcolm   ??? HX UROLOGICAL  10/25/2014    [facility], PVP, Dr. Maida Sale   ??? LASER VAPORIZATION SURGERY PROSTATE, COMPLETE  08/2010       Social History   Substance Use Topics   ??? Smoking status: Never Smoker   ??? Smokeless tobacco: Never Used   ??? Alcohol use No       Allergies   Allergen Reactions   ??? Ambien [Zolpidem] Other (comments)     confusion   ??? Ativan [Lorazepam] Other (comments)     Confusion   ??? Benadryl [Diphenhydramine Hcl] Other (comments)     Tremors and shaking       Family History   Problem Relation Age of Onset   ??? Cancer Brother      prostate   ??? Diabetes Brother    ??? Cancer Brother      prostate   ??? Diabetes Brother        Current Outpatient Prescriptions   Medication Sig Dispense Refill   ??? co-enzyme Q-10 (CO Q-10) 100 mg capsule 100 mg.     ??? acetaminophen (TYLENOL) 325 mg tablet Take  by mouth every four (4)  hours as needed for Pain.     ??? trimethoprim-sulfamethoxazole (BACTRIM DS, SEPTRA DS) 160-800 mg per tablet Take 1 Tab by mouth two (2) times a day for 7 days. 14 Tab 0   ??? cholecalciferol, vitamin D3, 2,000 unit tab Take 2,000 Units by mouth daily.           Review of Systems  Constitutional: Fever: No  Skin: Rash: No  HEENT: Hearing difficulty: Yes  Eyes: Blurred vision: No  Cardiovascular: Chest pain: No  Respiratory: Shortness of breath: No  Gastrointestinal: Nausea/vomiting: No  Musculoskeletal: Back pain: No  Neurological: Weakness: No  Psychological: Memory loss: No  Comments/additional findings:       REVIEW OF LABS AND IMAGING:      Results for orders placed or performed in visit on 12/29/15   URINE C&S   Result Value Ref Range    FINAL REPORT Microbiology results (A)    AMB POC URINALYSIS DIP STICK AUTO W/O MICRO   Result Value Ref Range    Color (UA POC) Yellow     Clarity (UA POC) Cloudy     Glucose (UA POC) Negative Negative    Bilirubin (UA POC) Negative Negative    Ketones (UA POC) Negative Negative    Specific gravity (UA POC) 1.015 1.001 - 1.035    Blood (UA POC) Trace Negative    pH (UA POC) 7.5 4.6 - 8.0    Protein (UA POC) 1+ Negative mg/dL    Urobilinogen (UA POC) 0.2 mg/dL 0.2 - 1    Nitrites (UA POC) Positive Negative    Leukocyte esterase (UA POC) 3+ Negative           A copy of today's office visit with all pertinent imaging results and labs were sent to the referring physician.        Doyle Askew, MD          Documentation provided by Carie Caddy, medical scribe for Doyle Askew, MD

## 2016-01-02 LAB — URINE C&S

## 2016-01-02 NOTE — Progress Notes (Signed)
Please inform pt that his Urine culture is positive and to continue on Bactrim DS.  Thanks, Erasmo Downer

## 2016-01-02 NOTE — Progress Notes (Signed)
Left vm for pt to return call regarding pos urine culture.

## 2016-01-02 NOTE — Telephone Encounter (Signed)
Left vm for pt to return my call regarding pos urine culture.

## 2016-01-02 NOTE — Telephone Encounter (Signed)
Notes Recorded by Jerelene Redden, PA-C on 01/02/2016 at 10:18 AM  Please inform pt that his Urine culture is positive and to continue on Bactrim DS. ??Thanks, Erasmo Downer

## 2016-01-03 NOTE — Telephone Encounter (Signed)
Pt informed of pos culture and to continue Bactrim DS, pt understood plan. He will finish the last few days of medication he has.

## 2016-01-06 MED ORDER — TRIMETHOPRIM-SULFAMETHOXAZOLE 160 MG-800 MG TAB
160-800 mg | ORAL_TABLET | Freq: Two times a day (BID) | ORAL | 0 refills | Status: AC
Start: 2016-01-06 — End: 2016-01-13

## 2016-01-06 NOTE — Progress Notes (Signed)
Per Georgann Housekeeper, PA-C Please inform pt that his Urine culture is positive and to continue on Bactrim DS. ??Thanks, Erasmo Downer    Patient informed and agreed to plan. Med sent to [place] on file.  Maurene Capes, [facility]

## 2016-01-06 NOTE — Telephone Encounter (Signed)
Per Georgann Housekeeper, PA-C Please inform pt that his Urine culture is positive and to continue on Bactrim DS. ??Thanks, Erasmo Downer    Patient informed and agreed to plan. Med sent to [place] on file.  Maurene Capes, [facility]

## 2016-01-24 DIAGNOSIS — C8290 Follicular lymphoma, unspecified, unspecified site: Secondary | ICD-10-CM | POA: Diagnosis not present

## 2016-01-24 DIAGNOSIS — G629 Polyneuropathy, unspecified: Secondary | ICD-10-CM | POA: Diagnosis not present

## 2016-01-24 DIAGNOSIS — D801 Nonfamilial hypogammaglobulinemia: Secondary | ICD-10-CM | POA: Diagnosis not present

## 2016-01-24 DIAGNOSIS — C8338 Diffuse large B-cell lymphoma, lymph nodes of multiple sites: Secondary | ICD-10-CM | POA: Diagnosis not present

## 2016-01-24 DIAGNOSIS — M549 Dorsalgia, unspecified: Secondary | ICD-10-CM | POA: Diagnosis not present

## 2016-01-24 DIAGNOSIS — C8588 Other specified types of non-Hodgkin lymphoma, lymph nodes of multiple sites: Secondary | ICD-10-CM | POA: Diagnosis not present

## 2016-01-24 DIAGNOSIS — E559 Vitamin D deficiency, unspecified: Secondary | ICD-10-CM | POA: Diagnosis not present

## 2016-01-24 DIAGNOSIS — D709 Neutropenia, unspecified: Secondary | ICD-10-CM | POA: Diagnosis not present

## 2016-01-31 DIAGNOSIS — M546 Pain in thoracic spine: Secondary | ICD-10-CM | POA: Diagnosis not present

## 2016-02-09 DIAGNOSIS — M546 Pain in thoracic spine: Secondary | ICD-10-CM | POA: Diagnosis not present

## 2016-02-14 DIAGNOSIS — M546 Pain in thoracic spine: Secondary | ICD-10-CM | POA: Diagnosis not present

## 2016-02-16 DIAGNOSIS — M546 Pain in thoracic spine: Secondary | ICD-10-CM | POA: Diagnosis not present

## 2016-02-21 DIAGNOSIS — M546 Pain in thoracic spine: Secondary | ICD-10-CM | POA: Diagnosis not present

## 2016-02-24 DIAGNOSIS — M546 Pain in thoracic spine: Secondary | ICD-10-CM | POA: Diagnosis not present

## 2016-03-01 DIAGNOSIS — M546 Pain in thoracic spine: Secondary | ICD-10-CM | POA: Diagnosis not present

## 2016-03-01 DIAGNOSIS — R8299 Other abnormal findings in urine: Secondary | ICD-10-CM | POA: Diagnosis not present

## 2016-03-01 DIAGNOSIS — M4184 Other forms of scoliosis, thoracic region: Secondary | ICD-10-CM | POA: Diagnosis not present

## 2016-03-01 DIAGNOSIS — E041 Nontoxic single thyroid nodule: Secondary | ICD-10-CM | POA: Diagnosis not present

## 2016-03-01 DIAGNOSIS — Z0000 Encounter for general adult medical examination without abnormal findings: Secondary | ICD-10-CM | POA: Diagnosis not present

## 2016-03-06 DIAGNOSIS — R8299 Other abnormal findings in urine: Secondary | ICD-10-CM | POA: Diagnosis not present

## 2016-03-06 DIAGNOSIS — E041 Nontoxic single thyroid nodule: Secondary | ICD-10-CM | POA: Diagnosis not present

## 2016-03-06 DIAGNOSIS — M546 Pain in thoracic spine: Secondary | ICD-10-CM | POA: Diagnosis not present

## 2016-03-06 DIAGNOSIS — M4184 Other forms of scoliosis, thoracic region: Secondary | ICD-10-CM | POA: Diagnosis not present

## 2016-03-06 DIAGNOSIS — Z0000 Encounter for general adult medical examination without abnormal findings: Secondary | ICD-10-CM | POA: Diagnosis not present

## 2016-03-08 DIAGNOSIS — R8299 Other abnormal findings in urine: Secondary | ICD-10-CM | POA: Diagnosis not present

## 2016-03-08 DIAGNOSIS — E041 Nontoxic single thyroid nodule: Secondary | ICD-10-CM | POA: Diagnosis not present

## 2016-03-08 DIAGNOSIS — M4184 Other forms of scoliosis, thoracic region: Secondary | ICD-10-CM | POA: Diagnosis not present

## 2016-03-08 DIAGNOSIS — Z0000 Encounter for general adult medical examination without abnormal findings: Secondary | ICD-10-CM | POA: Diagnosis not present

## 2016-03-08 DIAGNOSIS — M546 Pain in thoracic spine: Secondary | ICD-10-CM | POA: Diagnosis not present

## 2016-03-13 DIAGNOSIS — M546 Pain in thoracic spine: Secondary | ICD-10-CM | POA: Diagnosis not present

## 2016-03-13 DIAGNOSIS — R8299 Other abnormal findings in urine: Secondary | ICD-10-CM | POA: Diagnosis not present

## 2016-03-13 DIAGNOSIS — M4184 Other forms of scoliosis, thoracic region: Secondary | ICD-10-CM | POA: Diagnosis not present

## 2016-03-13 DIAGNOSIS — Z0000 Encounter for general adult medical examination without abnormal findings: Secondary | ICD-10-CM | POA: Diagnosis not present

## 2016-03-13 DIAGNOSIS — E041 Nontoxic single thyroid nodule: Secondary | ICD-10-CM | POA: Diagnosis not present

## 2016-03-15 DIAGNOSIS — W57XXXA Bitten or stung by nonvenomous insect and other nonvenomous arthropods, initial encounter: Secondary | ICD-10-CM | POA: Diagnosis not present

## 2016-03-15 DIAGNOSIS — M4184 Other forms of scoliosis, thoracic region: Secondary | ICD-10-CM | POA: Diagnosis not present

## 2016-03-15 DIAGNOSIS — Z0000 Encounter for general adult medical examination without abnormal findings: Secondary | ICD-10-CM | POA: Diagnosis not present

## 2016-03-15 DIAGNOSIS — M546 Pain in thoracic spine: Secondary | ICD-10-CM | POA: Diagnosis not present

## 2016-03-15 DIAGNOSIS — S90862A Insect bite (nonvenomous), left foot, initial encounter: Secondary | ICD-10-CM | POA: Diagnosis not present

## 2016-03-15 DIAGNOSIS — R8299 Other abnormal findings in urine: Secondary | ICD-10-CM | POA: Diagnosis not present

## 2016-03-15 DIAGNOSIS — E041 Nontoxic single thyroid nodule: Secondary | ICD-10-CM | POA: Diagnosis not present

## 2016-03-20 DIAGNOSIS — Z0000 Encounter for general adult medical examination without abnormal findings: Secondary | ICD-10-CM | POA: Diagnosis not present

## 2016-03-20 DIAGNOSIS — R8299 Other abnormal findings in urine: Secondary | ICD-10-CM | POA: Diagnosis not present

## 2016-03-20 DIAGNOSIS — E041 Nontoxic single thyroid nodule: Secondary | ICD-10-CM | POA: Diagnosis not present

## 2016-03-20 DIAGNOSIS — M4184 Other forms of scoliosis, thoracic region: Secondary | ICD-10-CM | POA: Diagnosis not present

## 2016-03-20 DIAGNOSIS — M546 Pain in thoracic spine: Secondary | ICD-10-CM | POA: Diagnosis not present

## 2016-03-22 DIAGNOSIS — M546 Pain in thoracic spine: Secondary | ICD-10-CM | POA: Diagnosis not present

## 2016-03-22 DIAGNOSIS — Z0000 Encounter for general adult medical examination without abnormal findings: Secondary | ICD-10-CM | POA: Diagnosis not present

## 2016-03-22 DIAGNOSIS — E041 Nontoxic single thyroid nodule: Secondary | ICD-10-CM | POA: Diagnosis not present

## 2016-03-22 DIAGNOSIS — M4184 Other forms of scoliosis, thoracic region: Secondary | ICD-10-CM | POA: Diagnosis not present

## 2016-03-22 DIAGNOSIS — R8299 Other abnormal findings in urine: Secondary | ICD-10-CM | POA: Diagnosis not present

## 2016-03-27 DIAGNOSIS — N281 Cyst of kidney, acquired: Secondary | ICD-10-CM | POA: Diagnosis not present

## 2016-03-27 DIAGNOSIS — N401 Enlarged prostate with lower urinary tract symptoms: Secondary | ICD-10-CM | POA: Diagnosis not present

## 2016-03-27 DIAGNOSIS — R8299 Other abnormal findings in urine: Secondary | ICD-10-CM | POA: Diagnosis not present

## 2016-03-27 DIAGNOSIS — Z0000 Encounter for general adult medical examination without abnormal findings: Secondary | ICD-10-CM | POA: Diagnosis not present

## 2016-03-27 DIAGNOSIS — M419 Scoliosis, unspecified: Secondary | ICD-10-CM | POA: Diagnosis not present

## 2016-03-27 DIAGNOSIS — M546 Pain in thoracic spine: Secondary | ICD-10-CM | POA: Diagnosis not present

## 2016-03-27 DIAGNOSIS — E041 Nontoxic single thyroid nodule: Secondary | ICD-10-CM | POA: Diagnosis not present

## 2016-03-27 DIAGNOSIS — M4184 Other forms of scoliosis, thoracic region: Secondary | ICD-10-CM | POA: Diagnosis not present

## 2016-03-27 DIAGNOSIS — B880 Other acariasis: Secondary | ICD-10-CM | POA: Diagnosis not present

## 2016-03-29 DIAGNOSIS — Z0000 Encounter for general adult medical examination without abnormal findings: Secondary | ICD-10-CM | POA: Diagnosis not present

## 2016-03-29 DIAGNOSIS — M4184 Other forms of scoliosis, thoracic region: Secondary | ICD-10-CM | POA: Diagnosis not present

## 2016-03-29 DIAGNOSIS — M546 Pain in thoracic spine: Secondary | ICD-10-CM | POA: Diagnosis not present

## 2016-03-29 DIAGNOSIS — E041 Nontoxic single thyroid nodule: Secondary | ICD-10-CM | POA: Diagnosis not present

## 2016-03-29 DIAGNOSIS — R8299 Other abnormal findings in urine: Secondary | ICD-10-CM | POA: Diagnosis not present

## 2016-04-05 DIAGNOSIS — M546 Pain in thoracic spine: Secondary | ICD-10-CM | POA: Diagnosis not present

## 2016-04-10 DIAGNOSIS — M546 Pain in thoracic spine: Secondary | ICD-10-CM | POA: Diagnosis not present

## 2016-07-09 MED ORDER — CIPROFLOXACIN 500 MG TAB
500 mg | ORAL_TABLET | Freq: Two times a day (BID) | ORAL | 0 refills | Status: AC
Start: 2016-07-09 — End: 2016-07-16

## 2016-07-09 NOTE — Telephone Encounter (Signed)
Per Dr. Maida Sale, pt can come in to give a urine sample for cx.  Cipro will be called into his pharmacy.  When cx comes back we will change abx if needed.  Patient's wife was informed, and pt will come in tomorrow to give urine sample.    Kathlene Cote, [facility]

## 2016-07-09 NOTE — Telephone Encounter (Signed)
Pt called with c/o possible UTI.  Pt currently CIC.  Pt complains of dysuria and called for appt or prescription    Kathlene Cote, [facility]

## 2016-07-10 ENCOUNTER — Institutional Professional Consult (permissible substitution): Admit: 2016-07-10 | Discharge: 2016-07-10 | Payer: MEDICARE | Primary: Internal Medicine

## 2016-07-10 DIAGNOSIS — R300 Dysuria: Secondary | ICD-10-CM | POA: Diagnosis not present

## 2016-07-10 DIAGNOSIS — N138 Other obstructive and reflux uropathy: Secondary | ICD-10-CM | POA: Diagnosis not present

## 2016-07-10 DIAGNOSIS — N401 Enlarged prostate with lower urinary tract symptoms: Secondary | ICD-10-CM | POA: Diagnosis not present

## 2016-07-10 DIAGNOSIS — R339 Retention of urine, unspecified: Secondary | ICD-10-CM | POA: Diagnosis not present

## 2016-07-10 LAB — AMB POC URINALYSIS DIP STICK AUTO W/O MICRO
Bilirubin (UA POC): NEGATIVE
Glucose (UA POC): NEGATIVE
Ketones (UA POC): NEGATIVE
Nitrites (UA POC): NEGATIVE
Protein (UA POC): NEGATIVE mg/dL
Specific gravity (UA POC): 1.01 (ref 1.001–1.035)
Urobilinogen (UA POC): 0.2 (ref 0.2–1)
pH (UA POC): 7 (ref 4.6–8.0)

## 2016-07-10 NOTE — Progress Notes (Signed)
Reviewed  Shawn Stock B Alver Leete, MD

## 2016-07-10 NOTE — Progress Notes (Signed)
[name] is here today for urine culture per Dr. Verner Mould order.    Dr. Odelia Gage was in the office as incident to.        Visit Vitals   ??? BP 110/70   ??? Temp 98.3 ??F (36.8 ??C)   ??? Ht 5\' 9"  (1.753 m)   ??? Wt 158 lb (71.7 kg)   ??? BMI 23.33 kg/m2        The pain is moderate.  Severity:  7     Associated signs and symptoms:    "Urethral pain"  Dysuria    Patient c/o odor to urine NO  Decreased output: NO  Difficulty voiding: NO  Burning with urination: YES  Incontinence:  NO  Split Stream: NO    Any recent Urologic surgeries or procedures:  No  If so - what type of surgery:  None    Any History of Stones:  NO     History of bladder tumor:  NO  Recurrent UTI's:  YES    Urine was obtained by:  Straight cath     Procedure:   The genital and perineal areas were cleansed with antiseptic solution on the cotton balls.  With clean gloves the area was cleansed with antiseptic solution again.  I applied sterile lubricant liberally to the catheter tip, lubricating at least six inches of the catheter.  A 14 fr cath catheter was inserted into the urinary meatus. When urine started to flow, I inserted the catheter approximately one inch further without difficulty and bladder drained.      Catheter size:  14  Type:  Straight-tip   Material:  All Silicone    This is not a repeat urine culture.   UA performed: Yes    Results for orders placed or performed in visit on 07/10/16   AMB POC URINALYSIS DIP STICK AUTO W/O MICRO   Result Value Ref Range    Color (UA POC) Yellow     Clarity (UA POC) Cloudy     Glucose (UA POC) Negative Negative    Bilirubin (UA POC) Negative Negative    Ketones (UA POC) Negative Negative    Specific gravity (UA POC) 1.010 1.001 - 1.035    Blood (UA POC) Trace Negative    pH (UA POC) 7.0 4.6 - 8.0    Protein (UA POC) Negative Negative mg/dL    Urobilinogen (UA POC) 0.2 mg/dL 0.2 - 1    Nitrites (UA POC) Negative Negative    Leukocyte esterase (UA POC) 2+ Negative         Urine was sent to [facility] for processing of urine culture.   Patient is informed that it will be at least 48 hours before results are available     Orders Placed This Encounter   ??? URINE C&S     Order Specific Question:   Specify all ANTIBIOTIC ALLERGIES:     Answer:   No Known Antibiotic Allergies     Order Specific Question:   Specify the urine source     Answer:   Straight Cath In/Out   ??? AMB POC URINALYSIS DIP STICK AUTO W/O MICRO       **PATIENT BROUGHT IN HIS OWN INTERMITTENT CATHETER AND CATH HIMSELF FOR URINE SAMPLE**    Maurene Capes, [name]

## 2016-07-12 LAB — URINE C&S

## 2016-07-12 NOTE — Progress Notes (Signed)
Please inform pt that his urine culture is negative.  If on antibiotics, he can discontinue.    Thanks, Erasmo Downer

## 2016-07-13 NOTE — Telephone Encounter (Addendum)
Left message on machine for patient to return my call.  Maurene Capes, [facility]    ----- Message from Jerelene Redden, PA-C sent at 07/12/2016  4:54 PM EDT -----  Please inform pt that his urine culture is negative.  If on antibiotics, he can discontinue.    Thanks, Erasmo Downer

## 2016-07-13 NOTE — Progress Notes (Signed)
Pt informed and agreed.

## 2016-07-13 NOTE — Progress Notes (Signed)
Left message on machine for patient to return my call.  Maurene Capes, [facility]

## 2016-09-11 DIAGNOSIS — D801 Nonfamilial hypogammaglobulinemia: Secondary | ICD-10-CM | POA: Diagnosis not present

## 2016-09-11 DIAGNOSIS — C8338 Diffuse large B-cell lymphoma, lymph nodes of multiple sites: Secondary | ICD-10-CM | POA: Diagnosis not present

## 2016-09-11 DIAGNOSIS — E559 Vitamin D deficiency, unspecified: Secondary | ICD-10-CM | POA: Diagnosis not present

## 2016-09-11 DIAGNOSIS — G5793 Unspecified mononeuropathy of bilateral lower limbs: Secondary | ICD-10-CM | POA: Diagnosis not present

## 2016-09-11 DIAGNOSIS — C8580 Other specified types of non-Hodgkin lymphoma, unspecified site: Secondary | ICD-10-CM | POA: Diagnosis not present

## 2016-10-09 ENCOUNTER — Institutional Professional Consult (permissible substitution): Admit: 2016-10-09 | Discharge: 2016-10-09 | Payer: MEDICARE | Primary: Internal Medicine

## 2016-10-09 DIAGNOSIS — R350 Frequency of micturition: Secondary | ICD-10-CM | POA: Diagnosis not present

## 2016-10-09 DIAGNOSIS — R3915 Urgency of urination: Secondary | ICD-10-CM | POA: Diagnosis not present

## 2016-10-09 LAB — AMB POC URINALYSIS DIP STICK AUTO W/O MICRO
Bilirubin (UA POC): NEGATIVE
Glucose (UA POC): NEGATIVE
Ketones (UA POC): NEGATIVE
Nitrites (UA POC): NEGATIVE
Specific gravity (UA POC): 1.015 (ref 1.001–1.035)
Urobilinogen (UA POC): 0.2 (ref 0.2–1)
pH (UA POC): 6.5 (ref 4.6–8.0)

## 2016-10-09 NOTE — Progress Notes (Signed)
[name] is here today for urine culture per Dr. Verner Mould order because patient is experiencing the below symptoms.  Dr. Dema Severin was in the office as incident to.        Visit Vitals   ??? BP 118/78   ??? Temp 98.2 ??F (36.8 ??C)   ??? Ht 5\' 9"  (1.753 m)   ??? Wt 158 lb (71.7 kg)   ??? BMI 23.33 kg/m2         Urine was obtained by:  Clean catch    The pain is moderate.  Severity:  7     Associated signs and symptoms:    urgency and frequency    Patient c/o odor to urine YES  Decreased output: NO  Difficulty voiding: NO  Burning with urination: YES  Incontinence:  NO  Split Stream: NO    Any recent Urologic surgeries or procedures:  No  If so - what type of surgery:  NONE    Any History of Stones:  NO     History of bladder tumor:  NO  Recurrent UTI's:  NO    This is not a repeat urine culture.   UA performed: Yes    Results for orders placed or performed in visit on 10/09/16   AMB POC URINALYSIS DIP STICK AUTO W/O MICRO   Result Value Ref Range    Color (UA POC) Yellow     Clarity (UA POC) Cloudy     Glucose (UA POC) Negative Negative    Bilirubin (UA POC) Negative Negative    Ketones (UA POC) Negative Negative    Specific gravity (UA POC) 1.015 1.001 - 1.035    Blood (UA POC) Trace Negative    pH (UA POC) 6.5 4.6 - 8.0    Protein (UA POC) 1+ Negative    Urobilinogen (UA POC) 0.2 mg/dL 0.2 - 1    Nitrites (UA POC) Negative Negative    Leukocyte esterase (UA POC) 2+ Negative        Urine was sent to [facility] for processing of urine culture.   Patient is informed that it will be at least 48 hours before results are available     Orders Placed This Encounter   ??? URINE C&S     Order Specific Question:   Specify all ANTIBIOTIC ALLERGIES:     Answer:   No Known Antibiotic Allergies     Order Specific Question:   Specify the urine source     Answer:   Clean Catch   ??? AMB POC URINALYSIS DIP STICK AUTO W/O MICRO          Maurene Capes [facility]       10/09/2016  Chart reviewed   I agree with assessment and plan as outlined   Mal Amabile, MD

## 2016-10-11 LAB — URINE C&S

## 2016-10-11 MED ORDER — AMOXICILLIN-CLAVULANATE 500 MG-125 MG TAB
500-125 mg | ORAL_TABLET | Freq: Two times a day (BID) | ORAL | 0 refills | Status: AC
Start: 2016-10-11 — End: 2016-10-18

## 2016-10-11 NOTE — Progress Notes (Signed)
Per K Curry Augmentin 500 bid x 7 days  Lucilla Lame [facility]

## 2016-11-05 NOTE — Telephone Encounter (Signed)
Pt called and left message regarding possible UTI.  Pt states of pain with urination, and he also CIC.   I called pt back and wife answered phone stating pt had left for work and will be able to drop of ua for cx on tomorrow.  After HIPAA verification, wife was informed pt can drop off urine sample on 11/06/16 for cx.      Kathlene Cote, [facility]

## 2016-11-06 ENCOUNTER — Institutional Professional Consult (permissible substitution): Admit: 2016-11-06 | Discharge: 2016-11-06 | Payer: MEDICARE | Primary: Internal Medicine

## 2016-11-06 DIAGNOSIS — N390 Urinary tract infection, site not specified: Secondary | ICD-10-CM | POA: Diagnosis not present

## 2016-11-06 LAB — AMB POC URINALYSIS DIP STICK AUTO W/O MICRO
Bilirubin (UA POC): NEGATIVE
Blood (UA POC): NEGATIVE
Glucose (UA POC): NEGATIVE
Ketones (UA POC): NEGATIVE
Nitrites (UA POC): POSITIVE
Protein (UA POC): NEGATIVE
Specific gravity (UA POC): 1.02 (ref 1.001–1.035)
Urobilinogen (UA POC): 0.2 (ref 0.2–1)
pH (UA POC): 7 (ref 4.6–8.0)

## 2016-11-06 MED ORDER — TRIMETHOPRIM-SULFAMETHOXAZOLE 160 MG-800 MG TAB
160-800 mg | ORAL_TABLET | Freq: Two times a day (BID) | ORAL | 0 refills | Status: AC
Start: 2016-11-06 — End: 2016-11-13

## 2016-11-06 NOTE — Progress Notes (Addendum)
[name] is here today for urine culture per Dr. Maida Sale order because patient is experiencing the below symptoms.  Dr. Aurelio Jew was in the office as incident to.        Visit Vitals   ??? BP 120/70   ??? Temp 98.6 ??F (37 ??C)        Urine was obtained by:  Self-cath     The pain is mild.  Severity:  4     Associated signs and symptoms:    urgency and frequency    Patient c/o odor to urine YES  Decreased output: NO  Difficulty voiding: NO  Burning with urination: YES  Incontinence:  NO  Split Stream: NO    Any recent Urologic surgeries or procedures:  No  If so - what type of surgery:      Any History of Stones:  NO     History of bladder tumor:  NO  Recurrent UTI's:  YES    This is a repeat urine culture.   UA performed: Yes    Results for orders placed or performed in visit on 11/06/16   AMB POC URINALYSIS DIP STICK AUTO W/O MICRO   Result Value Ref Range    Color (UA POC) Amber     Clarity (UA POC) Cloudy     Glucose (UA POC) Negative Negative    Bilirubin (UA POC) Negative Negative    Ketones (UA POC) Negative Negative    Specific gravity (UA POC) 1.020 1.001 - 1.035    Blood (UA POC) Negative Negative    pH (UA POC) 7.0 4.6 - 8.0    Protein (UA POC) Negative Negative    Urobilinogen (UA POC) 0.2 mg/dL 0.2 - 1    Nitrites (UA POC) Positive Negative    Leukocyte esterase (UA POC) 2+ Negative        [facility] of urine culture.   Pt was given Bactrim DS 160-800mg  bid for 7 days.  Patient is informed that it will be at least 48 hours before results are available     Orders Placed This Encounter   ??? URINE C&S     Order Specific Question:   Specify all ANTIBIOTIC ALLERGIES:     Answer:   No Known Antibiotic Allergies     Order Specific Question:   Specify the urine source     Answer:   Self Cath   ??? AMB POC URINALYSIS DIP STICK AUTO W/O MICRO          Kathlene Cote, [facility]     Chart reviewed and agree  VBrugh

## 2016-11-08 LAB — URINE C&S

## 2016-11-08 NOTE — Progress Notes (Signed)
No additional comment

## 2016-11-22 ENCOUNTER — Ambulatory Visit: Admit: 2016-11-22 | Discharge: 2016-11-22 | Payer: MEDICARE | Attending: Urology | Primary: Internal Medicine

## 2016-11-22 DIAGNOSIS — N390 Urinary tract infection, site not specified: Secondary | ICD-10-CM | POA: Diagnosis not present

## 2016-11-22 DIAGNOSIS — R339 Retention of urine, unspecified: Secondary | ICD-10-CM | POA: Diagnosis not present

## 2016-11-22 DIAGNOSIS — R300 Dysuria: Secondary | ICD-10-CM | POA: Diagnosis not present

## 2016-11-22 NOTE — Progress Notes (Signed)
ASSESSMENT:   1. Recurrent UTI    2. Dysuria    3. Urinary retention               PLAN:    ?? Advised patient that even with sterilization with catheterization, UTI can still occur  ?? Discussed increasing his CIC to 3 times a day instead of 2   ?? Advised patient to increase his fluid intake and to try and drain at least 400 cc during catheterization  ?? Advised patient to only treat UTI if he is symptomatic   ?? RTO in one year to reassess symptoms         Chief Complaint   Patient presents with   ??? Benign Prostatic Hypertrophy     PVR=37   ??? (LUTS) Lower Urinary Tract Symptoms     pt does CIC   ??? Dysuria   ??? Urinary Retention       HISTORY OF PRESENT ILLNESS:  [name] is a 76 y.o. male who presents today for dysuria, BPH, urinary retention, and recurrent UTI. Patient is s/p GreenLight PVP 10/25/14. He is no longer taking any GU medications. He is able to void 100-200cc freely but still uses CIC x2 daily and expels 500-600cc of urine with each catheterization (was practicing CIC x 4 prior to PVP). Denies any issues with CIC. Patient does meticulously practice sterile catheterization. Patient has history of recurrent UTI and being treated empirically with Septra. He has had two + urine cultures in the last month. His symptoms are dysuria. No gross hematuria or dysuria recently. No fevers, chills, nausea or vomiting.     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 Patient in remission. 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        AUA Symptom Score 11/22/2016   Over the past month how often have you had the sensation that your bladder was not completely empty after you finished urinating? 5   Over the past month, how often have had to urinate again less than 2 hours after you last finished urinating? 5   Over the past month, how often have you found you stopped and started again several times when you urinated? 5    Over the past month, how often have you found it difficult to postpone urination? 3   Over the past month, how often have you had a weak urinary stream? 5   Over the past month, how often have you had to push or strain to begin urinating? 5   Over the past month, how many times did you most typically get up to urinate from the time you went to bed at night until the time you got up in the morning? 3   AUA Score 31   If you were to spend the rest of your life with your urinary condition the way it is now, how would you feel about that? Mixed-about equally satisfied       Past Medical History:   Diagnosis Date   ??? Acute UTI    ??? Atonic bladder    ??? Benign non-nodular prostatic hyperplasia with lower urinary tract symptoms    ??? Bilateral hydronephrosis    ??? BPH with obstruction/lower urinary tract symptoms    ??? Dysuria    ??? Non Hodgkin's lymphoma ([place])    ??? Recurrent UTI    ??? Renal cyst    ??? Retention of  urine, unspecified        Past Surgical History:   Procedure Laterality Date   ??? HX HERNIA REPAIR  1980s   ??? HX OTHER SURGICAL  1978-1979   ??? HX UROLOGICAL  05/02/2010    PNBx-TRUS Vol 62 cc's, Benign, pre-Bx PSA 4.8, Dr.Malcolm   ??? HX UROLOGICAL  10/25/2014    [facility], PVP, Dr. Maida Sale   ??? PR LASER VAPORIZATION SURGERY PROSTATE, COMPLETE  08/2010       Social History   Substance Use Topics   ??? Smoking status: Never Smoker   ??? Smokeless tobacco: Never Used   ??? Alcohol use No       Allergies   Allergen Reactions   ??? Ambien [Zolpidem] Other (comments)     confusion   ??? Ativan [Lorazepam] Other (comments)     Confusion   ??? Benadryl [Diphenhydramine Hcl] Other (comments)     Tremors and shaking       Family History   Problem Relation Age of Onset   ??? Cancer Brother      prostate   ??? Diabetes Brother    ??? Cancer Brother      prostate   ??? Diabetes Brother        Current Outpatient Prescriptions   Medication Sig Dispense Refill   ??? co-enzyme Q-10 (CO Q-10) 100 mg capsule 100 mg.      ??? acetaminophen (TYLENOL) 325 mg tablet Take  by mouth every four (4) hours as needed for Pain.     ??? cholecalciferol, vitamin D3, 2,000 unit tab Take 2,000 Units by mouth daily.           Review of Systems  Constitutional: Fever: No  Skin: Rash: No  HEENT: Hearing difficulty: No  Eyes: Blurred vision: No  Cardiovascular: Chest pain: No  Respiratory: Shortness of breath: No  Gastrointestinal: Nausea/vomiting: No  Musculoskeletal: Back pain: No  Neurological: Weakness: No  Psychological: Memory loss: No  Comments/additional findings:       REVIEW OF LABS AND IMAGING:      Results for orders placed or performed in visit on 11/06/16   URINE C&S   Result Value Ref Range    FINAL REPORT Microbiology results (A)    AMB POC URINALYSIS DIP STICK AUTO W/O MICRO   Result Value Ref Range    Color (UA POC) Amber     Clarity (UA POC) Cloudy     Glucose (UA POC) Negative Negative    Bilirubin (UA POC) Negative Negative    Ketones (UA POC) Negative Negative    Specific gravity (UA POC) 1.020 1.001 - 1.035    Blood (UA POC) Negative Negative    pH (UA POC) 7.0 4.6 - 8.0    Protein (UA POC) Negative Negative    Urobilinogen (UA POC) 0.2 mg/dL 0.2 - 1    Nitrites (UA POC) Positive Negative    Leukocyte esterase (UA POC) 2+ Negative           A copy of today's office visit with all pertinent imaging results and labs were sent to the referring physician.        Doyle Askew, MD    Documentation provided by Malena Edman, medical scribe for Doyle Askew, MD

## 2017-02-22 ENCOUNTER — Institutional Professional Consult (permissible substitution): Admit: 2017-02-22 | Discharge: 2017-02-22 | Payer: MEDICARE | Primary: Internal Medicine

## 2017-02-22 DIAGNOSIS — N390 Urinary tract infection, site not specified: Secondary | ICD-10-CM | POA: Diagnosis not present

## 2017-02-22 LAB — AMB POC URINALYSIS DIP STICK AUTO W/O MICRO
Bilirubin (UA POC): NEGATIVE
Glucose (UA POC): NEGATIVE
Nitrites (UA POC): POSITIVE
Specific gravity (UA POC): 1.025 (ref 1.001–1.035)
Urobilinogen (UA POC): 1 (ref 0.2–1)
pH (UA POC): 6 (ref 4.6–8.0)

## 2017-02-22 MED ORDER — TRIMETHOPRIM-SULFAMETHOXAZOLE 160 MG-800 MG TAB
160-800 mg | ORAL_TABLET | Freq: Two times a day (BID) | ORAL | 0 refills | Status: AC
Start: 2017-02-22 — End: 2017-02-27

## 2017-02-22 NOTE — Progress Notes (Signed)
.    [name] is here today for urine culture per Dr. Maida Sale order because patient is experiencing the below symptoms.  Dr. Mariel Kansky was in the office as incident to.        Visit Vitals   ??? BP 116/64   ??? Temp 98.3 ??F (36.8 ??C)   ??? Wt 158 lb (71.7 kg)   ??? BMI 23.33 kg/m2        Patient c/o odor to urine YES  Decreased output: NO  Difficulty voiding: NO  Burning with urination: YES  Incontinence:  NO  Split Stream: NO    Associated signs and symptoms:    none    The pain is moderate.  Severity:  3    Urine was obtained by:  Voided     Any recent Urologic surgeries or procedures:  No  If so - what type of surgery:  N/A    Any History of Stones:  NO     History of bladder tumor:  NO  Recurrent UTI's:  YES    This is not a repeat urine culture.   UA performed: Yes    Results for orders placed or performed in visit on 02/22/17   AMB POC URINALYSIS DIP STICK AUTO W/O MICRO   Result Value Ref Range    Color (UA POC) Yellow     Clarity (UA POC) Cloudy     Glucose (UA POC) Negative Negative    Bilirubin (UA POC) Negative Negative    Ketones (UA POC) 1+ Negative    Specific gravity (UA POC) 1.025 1.001 - 1.035    Blood (UA POC) 1+ Negative    pH (UA POC) 6.0 4.6 - 8.0    Protein (UA POC) 1+ Negative    Urobilinogen (UA POC) 1 mg/dL 0.2 - 1    Nitrites (UA POC) Positive Negative    Leukocyte esterase (UA POC) 3+ Negative        Urine was sent to [facility] for processing of urine culture.   Patient is informed that it will be at least 48 hours before results are available     Orders Placed This Encounter   ??? AMB POC URINALYSIS DIP STICK AUTO W/O MICRO          Marlet Korte S [place] Noyce CMA    I was present for the above intervention. I reviewed the chart and agree with the plan of care outlined in the nursing note.    Martie Round, MD  [facility]

## 2017-02-25 LAB — URINE C&S

## 2017-02-25 NOTE — Progress Notes (Signed)
Placed on Septra. This is sensitive.

## 2017-03-29 ENCOUNTER — Ambulatory Visit: Admit: 2017-03-29 | Discharge: 2017-03-29 | Payer: MEDICARE | Attending: Medical | Primary: Internal Medicine

## 2017-03-29 DIAGNOSIS — R339 Retention of urine, unspecified: Secondary | ICD-10-CM | POA: Diagnosis not present

## 2017-03-29 DIAGNOSIS — N401 Enlarged prostate with lower urinary tract symptoms: Secondary | ICD-10-CM | POA: Diagnosis not present

## 2017-03-29 DIAGNOSIS — R300 Dysuria: Secondary | ICD-10-CM | POA: Diagnosis not present

## 2017-03-29 DIAGNOSIS — N390 Urinary tract infection, site not specified: Secondary | ICD-10-CM | POA: Diagnosis not present

## 2017-03-29 DIAGNOSIS — N138 Other obstructive and reflux uropathy: Secondary | ICD-10-CM | POA: Diagnosis not present

## 2017-03-29 DIAGNOSIS — N312 Flaccid neuropathic bladder, not elsewhere classified: Secondary | ICD-10-CM | POA: Diagnosis not present

## 2017-03-29 DIAGNOSIS — N398 Other specified disorders of urinary system: Secondary | ICD-10-CM | POA: Diagnosis not present

## 2017-03-29 LAB — AMB POC URINALYSIS DIP STICK AUTO W/O MICRO
Bilirubin (UA POC): NEGATIVE
Glucose (UA POC): NEGATIVE
Ketones (UA POC): NEGATIVE
Nitrites (UA POC): NEGATIVE
Specific gravity (UA POC): 1.015 (ref 1.001–1.035)
Urobilinogen (UA POC): 0.2 (ref 0.2–1)
pH (UA POC): 6 (ref 4.6–8.0)

## 2017-03-29 NOTE — Progress Notes (Signed)
ASSESSMENT:     ICD-10-CM ICD-9-CM    1. Recurrent UTI N39.0 599.0 AMB POC URINALYSIS DIP STICK AUTO W/O MICRO      URINE C&S   2. Dysuria R30.0 788.1    3. Urinary retention R33.9 788.20 REFERRAL TO PHYSICAL THERAPY ([facility])   4. Dysfunctional voiding of urine N39.8 599.9 REFERRAL TO PHYSICAL THERAPY ([facility])   5. 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 UA today shows 3+ Leuks and 1+ blood  ?? Urine sent for culture, will notify with results and treat accordingly. Ok to leave message on answering machine   ?? Uribel samples provided for dysuria  ?? Discussed PF PT to evaluate and treat pelvic floor dysfunction   ?? Continue to practice CIC x3 daily   Follow-up Disposition:  Return for Refer to PT and then return in 4 months to review.         Chief Complaint   Patient presents with   ??? UTI       HISTORY OF PRESENT ILLNESS:  [name] is a 76 y.o. male who presents today for new UTI symptoms and history of recurrent UTI, BPH, and hypotonic bladder. Patient is s/p GreenLight PVP on 10/25/14. He is no longer taking any GU medications. Patient practices CIC x3 daily (was practicing CIC x 4 prior to PVP). He does void in between catheterization, he is able to void 50-100cc freely. No issues with CIC. Patient does meticulously practice sterile catheterization. Patient has history of recurrent UTI, last positive UCx was on 02/22/17, he was treated with Septra. His UTI symptoms includes dysuria. Patient presents today symptomatic for an UTI. Patient notes symptoms started a few days ago but dysuria worsened today. Patient is currently takes D-mannos daily for UTI prevention. He is also on a Probiotics. No gross hematuria. No fever, chills.     Patient notes he chronic back issues.     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 Patient in remission. He had significant muscle  wasting and weight loss during the diagnosis process.       Last PSA 06/2014: 0.98 ng/mL    Last AUA: 31      Past Medical History:   Diagnosis Date   ??? Acute UTI    ??? Atonic bladder    ??? Benign non-nodular prostatic hyperplasia with lower urinary tract symptoms    ??? Bilateral hydronephrosis    ??? BPH with obstruction/lower urinary tract symptoms    ??? Dysuria    ??? Non Hodgkin's lymphoma ([place])    ??? Recurrent UTI    ??? Renal cyst    ??? Retention of urine, unspecified        Past Surgical History:   Procedure Laterality Date   ??? HX HERNIA REPAIR  1980s   ??? HX OTHER SURGICAL  1978-1979   ??? HX UROLOGICAL  05/02/2010    PNBx-TRUS Vol 62 cc's, Benign, pre-Bx PSA 4.8, Dr.Malcolm   ??? HX UROLOGICAL  10/25/2014    [facility], PVP, Dr. Maida Sale   ??? PR LASER VAPORIZATION SURGERY PROSTATE, COMPLETE  08/2010       Social History   Substance Use Topics   ??? Smoking status: Never Smoker   ??? Smokeless tobacco: Never Used   ??? Alcohol use No  Allergies   Allergen Reactions   ??? Ambien [Zolpidem] Other (comments)     confusion   ??? Ativan [Lorazepam] Other (comments)     Confusion   ??? Benadryl [Diphenhydramine Hcl] Other (comments)     Tremors and shaking       Family History   Problem Relation Age of Onset   ??? Cancer Brother      prostate   ??? Diabetes Brother    ??? Cancer Brother      prostate   ??? Diabetes Brother        Current Outpatient Prescriptions   Medication Sig Dispense Refill   ??? co-enzyme Q-10 (CO Q-10) 100 mg capsule 100 mg.     ??? acetaminophen (TYLENOL) 325 mg tablet Take  by mouth every four (4) hours as needed for Pain.     ??? cholecalciferol, vitamin D3, 2,000 unit tab Take 2,000 Units by mouth daily.           Review of Systems  Constitutional: Fever: No  Skin: Rash: No  HEENT: Hearing difficulty: No  Eyes: Blurred vision: No  Cardiovascular: Chest pain: No  Respiratory: Shortness of breath: No  Gastrointestinal: Nausea/vomiting: No  Musculoskeletal: Back pain: No  Neurological: Weakness: No  Psychological: Memory loss: No   Comments/additional findings:       Visit Vitals   ??? BP 116/78   ??? Ht 5\' 9"  (1.753 m)   ??? Wt 158 lb (71.7 kg)   ??? BMI 23.33 kg/m2     Constitutional: WDWN, Pleasant and appropriate affect, No acute distress.    CV:  No peripheral swelling noted  Respiratory: No respiratory distress or difficulties  Skin: No jaundice.    Neuro/Psych:  Alert and oriented x 3. Affect appropriate.         REVIEW OF LABS AND IMAGING:      Results for orders placed or performed in visit on 03/29/17   AMB POC URINALYSIS DIP STICK AUTO W/O MICRO   Result Value Ref Range    Color (UA POC) Yellow     Clarity (UA POC) Clear     Glucose (UA POC) Negative Negative    Bilirubin (UA POC) Negative Negative    Ketones (UA POC) Negative Negative    Specific gravity (UA POC) 1.015 1.001 - 1.035    Blood (UA POC) 1+ Negative    pH (UA POC) 6.0 4.6 - 8.0    Protein (UA POC) 3+ Negative    Urobilinogen (UA POC) 0.2 mg/dL 0.2 - 1    Nitrites (UA POC) Negative Negative    Leukocyte esterase (UA POC) 3+ Negative           A copy of today's office visit with all pertinent imaging results and labs were sent to the referring physician.        Jerelene Redden, PA-C    Documentation provided by Fortino Sic, medical scribe for Jerelene Redden, PA-C

## 2017-03-31 LAB — URINE C&S

## 2017-04-01 MED ORDER — AMOXICILLIN-CLAVULANATE 500 MG-125 MG TAB
500-125 mg | ORAL_TABLET | Freq: Two times a day (BID) | ORAL | 0 refills | Status: AC
Start: 2017-04-01 — End: 2017-04-08

## 2017-04-01 NOTE — Progress Notes (Signed)
Please inform pt that his urine culture was positive.  Please send in Augmentin 500 mg bid for 7 days.    Thanks, Dontae Minerva

## 2017-04-01 NOTE — Telephone Encounter (Signed)
Positive urine culture Med sent Per Lenoard Aden

## 2017-04-01 NOTE — Progress Notes (Signed)
Positive urine culture Med sent Per Lenoard Aden    Electronically signed by Arlyss Gandy at 04/01/17 [phone]

## 2017-05-09 DIAGNOSIS — M419 Scoliosis, unspecified: Secondary | ICD-10-CM | POA: Diagnosis not present

## 2017-05-09 DIAGNOSIS — C8290 Follicular lymphoma, unspecified, unspecified site: Secondary | ICD-10-CM | POA: Diagnosis not present

## 2017-05-09 DIAGNOSIS — Z0000 Encounter for general adult medical examination without abnormal findings: Secondary | ICD-10-CM | POA: Diagnosis not present

## 2017-05-23 ENCOUNTER — Ambulatory Visit
Admit: 2017-05-23 | Discharge: 2017-05-23 | Payer: MEDICARE | Attending: Rehabilitative and Restorative Service Providers" | Primary: Internal Medicine

## 2017-05-23 DIAGNOSIS — R39198 Other difficulties with micturition: Secondary | ICD-10-CM | POA: Diagnosis not present

## 2017-05-23 DIAGNOSIS — R3914 Feeling of incomplete bladder emptying: Secondary | ICD-10-CM | POA: Diagnosis not present

## 2017-05-23 DIAGNOSIS — R278 Other lack of coordination: Secondary | ICD-10-CM | POA: Diagnosis not present

## 2017-05-23 NOTE — Progress Notes (Addendum)
Physical Therapy Dysfunctional Voiding Evaluation    [name], 76 y.o. , male presents today for evaluation.    Past Medical History:   Diagnosis Date   ??? Acute UTI    ??? Atonic bladder    ??? Benign non-nodular prostatic hyperplasia with lower urinary tract symptoms    ??? Bilateral hydronephrosis    ??? BPH with obstruction/lower urinary tract symptoms    ??? Dysuria    ??? Non Hodgkin's lymphoma ([place])    ??? Recurrent UTI    ??? Renal cyst    ??? Retention of urine, unspecified        Pt referred by Mamie Nick, PA with a diagnosis of urinary retention, voiding dysfunction.  Pt's concerns are for same, symptoms chronic and may be from combined medical history including BPH, and lymphoma.  He reports having an indwelling catheter for 6 weeks at one time while in rehabilitation, as well as Greenlight in 2016.     Currently CIC 3x/day (approx every 8 hours), will sometimes feel urgency attempt urination and produce a small amount of 150-200 ml    Surgical and Medical History: refer to [place] History  married   Working 2 days/week - Dietitian at [employer]     Post Void Residual: 700 ml - cath   Comments: He has a large capacity compliant bladder with delayed sensations. He had DO at capacity without leakage. He voided with a prolonged low pressure detrusor contraction, low flow, partial valsalva, slight increase in EMG activity and poor emptying.  ??     Pad use:  none, does not require    Bowel function:  Regular BM, 5-7 per week    Pain complaint:  none associated with urinary issues (unless has a UTI)    Functional limitations:  Has to CIC - gets frequent UTI's, and it's difficult to self cath in public setting    Physical Exam     Postural assessment:  Kyphotic/forward head posture, distended abdomen    General Mobility:    WFL - will continue to evaluate for ROM, strength and mobility limitations as needed with future visits    EMG Evaluation of pelvic floor musculature       Standing baseline (perianal sensors):  5 mV    Seated baseline: <1.0 mV    Verbal instruction and performed "kegels" for sensorimotor education and awareness   Peaks varied 2.5 mV - >20 mV  (pt had some difficulty with isolation of PFM, but appeared to improve with practice)       Treatment and Education today:  Initiated pt education including anatomy and physiology of pelvic floor and it's relevance to bladder symptoms  Initiated pt education including lifestyle modifications pertinent to management of his condition.   Initiated treatment techniques, including neuromuscular reeducation utilizing manual/verbal cues or emg  Initiated HEP, to assist pt in taking an active role in rehabilitation and progress towards goals.   Patient prepared for voiding trial next visit.  Pt given home program education re:   kegels for 'sensorimotor awareness" and start practicing timed voiding approx 2 hours intervals during the day (as able), will keep CIC same, reviewed relaxed voiding (sit to void, lean forward, can 'blow out' but not valsalva)    Assessment    [name] presents with the following findings for pelvic orthopedic and skeletal findings:  Mild to moderate musculoskeletal dysfunction  - not likely  contributing to bladder dysfunction.    [name] presents with the following findings for pelvic floor motor function:  Poor sensorimotor awareness of pelvic floor function  Poorly coordinated contraction of pelvic floor with diminished release sensation with probable dysfunctional voiding pattern.    A current functional outcome assessment  was performed. The exam for functional outcome deficiencies was positive (abnormal exam).  Plan of care  was done..       Pt will benefit from skilled PT services to address the aforementioned symptoms. Good prognosis for goal achievement with regular therapy attendance and diligent home program completion.        Physical Therapy Short Term Goals to be achieved in 2-3 visits:       1. Pt will demonstrate verbal understanding of good dietary habits including adequate fluid intake, dietary considerations for good bladder habits   2. Pt will be independent with HEP for improved  PFM control for contraction and release, improved bladder emptying, and decreased urinary urgency and frequency.  3. Pt will demonstrate proper isolated pelvic floor contraction and/or relaxation for progression towards LTG's      Physical therapy Long Term goals to be achieved in 12  Weeks (08/23/17):    1. Pt will demonstrate verbal understanding of good bladder and bowel toileting habits including relaxed voiding, double voiding, urge suppression strategies, bladder and bowel diary, timed voiding, and proper hygiene for long term control of condition.  2. Pt to report normalized voiding frequency: daytime intervals of 2-3 hours and 0-1 time per night for a more productive workday and restful sleep.   This may minimize need to CIC.  3. Pt will demonstrate ability to maintain properly relaxed pelvic floor (below 1-58mV) at rest and during voiding for correction of voiding dysfunction.  4. Pt to demonstrate correction of urinary voiding dysfunction, as demonstrated by maintenance of relaxed pelvic floor on emg during voiding trial, and low PVR.  5. Pt will be independent with HEP for improved  PFM control to have long term control of condition.        Plan (may include)    Ongoing pt education and HEP advancement  Therapeutic exercise  Neuromotor re-education  Manual therapy  Trigger Point Dry Needling  Progressive PME's  Modalities as needed for pain control ([place], TENS, IFES, NMES, heat, ice)  Home rental or purchase TENS/IFES  Home rental neuromuscular electric stimulation for pelvic floor  EMG assessment  Biofeedback training  Bladder scan      Frequency:  every other week for 12 weeks, further adjust frequency of visits prn      Next visit focus on:   emg              Voiding Trial  Ongoing education    [phone] CI  [phone] [place]    Time in: 1:30 pm  Time out: 2:40 pm        Reviewed and agree with the plan.   Arnette Schaumann, MD

## 2017-05-30 ENCOUNTER — Institutional Professional Consult (permissible substitution)
Admit: 2017-05-30 | Discharge: 2017-05-30 | Payer: MEDICARE | Attending: Rehabilitative and Restorative Service Providers" | Primary: Internal Medicine

## 2017-05-30 DIAGNOSIS — R278 Other lack of coordination: Secondary | ICD-10-CM

## 2017-05-30 NOTE — Progress Notes (Signed)
I have reviewed Physical Therapy evaluation and agree as documented.    Chianne Byrns B Jackelynn Hosie, MD

## 2017-05-30 NOTE — Progress Notes (Signed)
Physical Therapy Treatment Note     Patient: [name]    DOS: 12/22/4008 Visit #: 2  Subjective    Pt reports:  Relaxed voiding techniques seem like they help a little bit, empties about 100 ml at given time    Estimates about 500 ml in bladder currently - declining bladder scan or void trial attempt     Pt wanted to come in today to touch base and let us know he's incorporating the things he learned last visit into his routine, and will continue at home.  He does not want to do anything more today.        Objective    No treatment today      Assessment    Unable to reassess; pt voices good understanding of relaxed and timed voiding techniques, which he will combine with  current practice of CIC 3x/day.  Pt appreciative of our efforts and feels he can continue independently with what he has learned.      Plan    Discontinue therapy    [id number] CI  [id number] [place]  [id number] CI

## 2017-07-11 NOTE — Telephone Encounter (Signed)
Patient called requesting his medical records. Informed patient I will send medical records department a message and they will contact him and there may be a fee to received records. He verbalized understanding and call was ended.

## 2017-07-23 DIAGNOSIS — E559 Vitamin D deficiency, unspecified: Secondary | ICD-10-CM | POA: Diagnosis not present

## 2017-07-23 DIAGNOSIS — D801 Nonfamilial hypogammaglobulinemia: Secondary | ICD-10-CM | POA: Diagnosis not present

## 2017-07-23 DIAGNOSIS — Z08 Encounter for follow-up examination after completed treatment for malignant neoplasm: Secondary | ICD-10-CM | POA: Diagnosis not present

## 2017-07-23 DIAGNOSIS — Z8572 Personal history of non-Hodgkin lymphomas: Secondary | ICD-10-CM | POA: Diagnosis not present

## 2017-07-23 DIAGNOSIS — D72810 Lymphocytopenia: Secondary | ICD-10-CM | POA: Diagnosis not present

## 2017-07-23 DIAGNOSIS — C8338 Diffuse large B-cell lymphoma, lymph nodes of multiple sites: Secondary | ICD-10-CM | POA: Diagnosis not present

## 2017-07-25 ENCOUNTER — Ambulatory Visit: Admit: 2017-07-25 | Discharge: 2017-07-25 | Payer: MEDICARE | Attending: Urology | Primary: Internal Medicine

## 2017-07-25 DIAGNOSIS — N312 Flaccid neuropathic bladder, not elsewhere classified: Secondary | ICD-10-CM | POA: Diagnosis not present

## 2017-07-25 DIAGNOSIS — N390 Urinary tract infection, site not specified: Secondary | ICD-10-CM | POA: Diagnosis not present

## 2017-07-25 NOTE — Progress Notes (Signed)
ASSESSMENT:   1. Hypotonic bladder    2. Recurrent UTI               PLAN:    ?? Doing well today - Continue CIC x3  ?? Keep follow up in march         Chief Complaint   Patient presents with   ??? Benign Prostatic Hypertrophy       HISTORY OF PRESENT ILLNESS:  [name] is a 76 y.o. male who presents today for urinary retention.Patient has been continuing CIC x3. He has a more sterile system for catheterization which has helped with his infections. Has not had any symptoms for UTI. No fevers, chills, nausea or vomiting. On d-mannose. Did have sessions of PT.     Patient moving to [place] soon.     REVIEW OF LABS AND IMAGING:      Past Medical History:   Diagnosis Date   ??? Acute UTI    ??? Atonic bladder    ??? Benign non-nodular prostatic hyperplasia with lower urinary tract symptoms    ??? Bilateral hydronephrosis    ??? BPH with obstruction/lower urinary tract symptoms    ??? Dysfunctional voiding of urine    ??? Dysuria    ??? Hypotonic bladder    ??? Non Hodgkin's lymphoma ([place])    ??? Recurrent UTI    ??? Renal cyst    ??? Retention of urine, unspecified        Past Surgical History:   Procedure Laterality Date   ??? HX HERNIA REPAIR  1980s   ??? HX OTHER SURGICAL  1978-1979   ??? HX UROLOGICAL  05/02/2010    PNBx-TRUS Vol 62 cc's, Benign, pre-Bx PSA 4.8, Dr.Malcolm   ??? HX UROLOGICAL  10/25/2014    [facility], PVP, Dr. Maida Sale   ??? PR LASER VAPORIZATION SURGERY PROSTATE, COMPLETE  08/2010       Social History     Tobacco Use   ??? Smoking status: Never Smoker   ??? Smokeless tobacco: Never Used   Substance Use Topics   ??? Alcohol use: No   ??? Drug use: No       Allergies   Allergen Reactions   ??? Ativan [Lorazepam] Other (comments)     Confusion   ??? Benadryl [Diphenhydramine Hcl] Other (comments)     Tremors and shaking   ??? Diphenhydramine Other (comments)     jitters   ??? Zolpidem Other (comments)     confusion  Memory problems upset stomach       Family History   Problem Relation Age of Onset   ??? Cancer Brother         prostate    ??? Diabetes Brother    ??? Cancer Brother         prostate   ??? Diabetes Brother        Current Outpatient Medications   Medication Sig Dispense Refill   ??? OTHER      ??? OTHER      ??? co-enzyme Q-10 (CO Q-10) 100 mg capsule 100 mg.     ??? acetaminophen (TYLENOL) 325 mg tablet Take  by mouth every four (4) hours as needed for Pain.     ??? cholecalciferol, vitamin D3, 2,000 unit tab Take 2,000 Units by mouth daily.           Review of Systems  Constitutional: Fever: No  Skin: Rash: No  HEENT: Hearing difficulty: No  Eyes: Blurred vision: No  Cardiovascular: Chest pain: No  Respiratory: Shortness of breath: No  Gastrointestinal: Nausea/vomiting: No  Musculoskeletal: Back pain: No  Neurological: Weakness: No  Psychological: Memory loss: No  Comments/additional findings:               A copy of today's office visit with all pertinent imaging results and labs were sent to the referring physician.        Doyle Askew, MD    Documentation provided by Malena Edman, medical scribe for Doyle Askew, MD

## 2017-10-29 DIAGNOSIS — R3911 Hesitancy of micturition: Secondary | ICD-10-CM | POA: Diagnosis not present

## 2017-10-29 DIAGNOSIS — C8590 Non-Hodgkin lymphoma, unspecified, unspecified site: Secondary | ICD-10-CM | POA: Diagnosis not present

## 2017-11-15 NOTE — Telephone Encounter (Signed)
Received call from  [place] from [place] on status of order for cath supplies that she had faxed multiple times and has not received a response.Advised to r fax order. Will send message to nurse.

## 2017-11-21 ENCOUNTER — Encounter: Attending: Urology | Primary: Internal Medicine

## 2017-12-03 ENCOUNTER — Encounter: Attending: Urology | Primary: Internal Medicine

## 2018-04-15 ENCOUNTER — Encounter: Payer: Self-pay | Admitting: Hematology

## 2018-07-04 DIAGNOSIS — D72818 Other decreased white blood cell count: Secondary | ICD-10-CM | POA: Diagnosis not present

## 2018-07-04 DIAGNOSIS — R490 Dysphonia: Secondary | ICD-10-CM | POA: Diagnosis not present

## 2018-07-04 DIAGNOSIS — Z08 Encounter for follow-up examination after completed treatment for malignant neoplasm: Secondary | ICD-10-CM | POA: Diagnosis not present

## 2018-07-04 DIAGNOSIS — C8338 Diffuse large B-cell lymphoma, lymph nodes of multiple sites: Secondary | ICD-10-CM | POA: Diagnosis not present

## 2018-07-04 DIAGNOSIS — Z8572 Personal history of non-Hodgkin lymphomas: Secondary | ICD-10-CM | POA: Diagnosis not present

## 2018-07-04 DIAGNOSIS — R0989 Other specified symptoms and signs involving the circulatory and respiratory systems: Secondary | ICD-10-CM | POA: Diagnosis not present

## 2018-10-10 DIAGNOSIS — N390 Urinary tract infection, site not specified: Secondary | ICD-10-CM | POA: Diagnosis not present

## 2018-10-10 DIAGNOSIS — Y846 Urinary catheterization as the cause of abnormal reaction of the patient, or of later complication, without mention of misadventure at the time of the procedure: Secondary | ICD-10-CM | POA: Diagnosis not present

## 2018-10-17 ENCOUNTER — Encounter: Payer: Self-pay | Admitting: Internal Medicine

## 2018-10-17 DIAGNOSIS — Z1321 Encounter for screening for nutritional disorder: Secondary | ICD-10-CM | POA: Diagnosis not present

## 2018-10-17 DIAGNOSIS — Z1329 Encounter for screening for other suspected endocrine disorder: Secondary | ICD-10-CM | POA: Diagnosis not present

## 2018-10-17 DIAGNOSIS — E162 Hypoglycemia, unspecified: Secondary | ICD-10-CM | POA: Diagnosis not present

## 2018-10-17 DIAGNOSIS — C8590 Non-Hodgkin lymphoma, unspecified, unspecified site: Secondary | ICD-10-CM | POA: Diagnosis not present

## 2018-10-17 DIAGNOSIS — N182 Chronic kidney disease, stage 2 (mild): Secondary | ICD-10-CM | POA: Diagnosis not present

## 2018-10-17 DIAGNOSIS — Z125 Encounter for screening for malignant neoplasm of prostate: Secondary | ICD-10-CM | POA: Diagnosis not present

## 2018-10-17 DIAGNOSIS — N401 Enlarged prostate with lower urinary tract symptoms: Secondary | ICD-10-CM | POA: Diagnosis not present

## 2018-10-17 DIAGNOSIS — E559 Vitamin D deficiency, unspecified: Secondary | ICD-10-CM | POA: Diagnosis not present

## 2018-10-24 DIAGNOSIS — Z8572 Personal history of non-Hodgkin lymphomas: Secondary | ICD-10-CM | POA: Diagnosis not present

## 2018-10-24 DIAGNOSIS — N400 Enlarged prostate without lower urinary tract symptoms: Secondary | ICD-10-CM | POA: Diagnosis not present

## 2018-10-24 DIAGNOSIS — N399 Disorder of urinary system, unspecified: Secondary | ICD-10-CM | POA: Diagnosis not present

## 2018-10-24 DIAGNOSIS — Z0001 Encounter for general adult medical examination with abnormal findings: Secondary | ICD-10-CM | POA: Diagnosis not present

## 2018-11-04 DIAGNOSIS — C8590 Non-Hodgkin lymphoma, unspecified, unspecified site: Secondary | ICD-10-CM | POA: Diagnosis not present

## 2018-11-04 DIAGNOSIS — Y846 Urinary catheterization as the cause of abnormal reaction of the patient, or of later complication, without mention of misadventure at the time of the procedure: Secondary | ICD-10-CM | POA: Diagnosis not present

## 2018-11-04 DIAGNOSIS — N390 Urinary tract infection, site not specified: Secondary | ICD-10-CM | POA: Diagnosis not present

## 2018-11-04 DIAGNOSIS — R301 Vesical tenesmus: Secondary | ICD-10-CM | POA: Diagnosis not present

## 2018-11-04 DIAGNOSIS — R300 Dysuria: Secondary | ICD-10-CM | POA: Diagnosis not present

## 2018-11-04 DIAGNOSIS — R3911 Hesitancy of micturition: Secondary | ICD-10-CM | POA: Diagnosis not present

## 2018-12-09 ENCOUNTER — Ambulatory Visit (HOSPITAL_COMMUNITY): Payer: Medicare Other | Admitting: Hematology

## 2018-12-12 ENCOUNTER — Ambulatory Visit (INDEPENDENT_AMBULATORY_CARE_PROVIDER_SITE_OTHER): Payer: Medicare Other | Admitting: Urology

## 2018-12-12 ENCOUNTER — Other Ambulatory Visit (HOSPITAL_COMMUNITY)
Admission: RE | Admit: 2018-12-12 | Discharge: 2018-12-12 | Disposition: A | Discharge status: Home / Self Care | Payer: Medicare Other | Source: Other Acute Inpatient Hospital | Attending: Urology | Admitting: Urology

## 2018-12-12 DIAGNOSIS — N3000 Acute cystitis without hematuria: Secondary | ICD-10-CM

## 2018-12-12 DIAGNOSIS — N312 Flaccid neuropathic bladder, not elsewhere classified: Secondary | ICD-10-CM

## 2018-12-12 LAB — URINALYSIS, COMPLETE (UACMP) WITH MICROSCOPIC
Bilirubin Urine: NEGATIVE
Glucose, UA: NEGATIVE mg/dL
Hgb urine dipstick: NEGATIVE
Ketones, ur: NEGATIVE mg/dL
Nitrite: NEGATIVE
Protein, ur: NEGATIVE mg/dL
Specific Gravity, Urine: 1.017 (ref 1.005–1.030)
pH: 7 (ref 5.0–8.0)

## 2018-12-14 LAB — URINE CULTURE: Culture: >=100000 — AB

## 2018-12-19 ENCOUNTER — Inpatient Hospital Stay (HOSPITAL_COMMUNITY): Payer: Medicare Other

## 2018-12-19 ENCOUNTER — Inpatient Hospital Stay (HOSPITAL_COMMUNITY): Payer: Medicare Other | Attending: Hematology | Admitting: Hematology

## 2018-12-19 ENCOUNTER — Encounter (HOSPITAL_COMMUNITY): Payer: Self-pay | Admitting: Hematology

## 2018-12-19 ENCOUNTER — Other Ambulatory Visit: Payer: Self-pay

## 2018-12-19 DIAGNOSIS — Z9221 Personal history of antineoplastic chemotherapy: Secondary | ICD-10-CM

## 2018-12-19 DIAGNOSIS — Z87440 Personal history of urinary (tract) infections: Secondary | ICD-10-CM | POA: Diagnosis not present

## 2018-12-19 DIAGNOSIS — C8330 Diffuse large B-cell lymphoma, unspecified site: Secondary | ICD-10-CM

## 2018-12-19 DIAGNOSIS — Z8572 Personal history of non-Hodgkin lymphomas: Secondary | ICD-10-CM | POA: Insufficient documentation

## 2018-12-19 LAB — CBC WITH DIFFERENTIAL/PLATELET
Abs Immature Granulocytes: 0 10*3/uL (ref 0.00–0.07)
Basophils Absolute: 0 10*3/uL (ref 0.0–0.1)
Basophils Relative: 1 %
Eosinophils Absolute: 0.1 10*3/uL (ref 0.0–0.5)
Eosinophils Relative: 2 %
HCT: 40.7 % (ref 39.0–52.0)
Hemoglobin: 13.6 g/dL (ref 13.0–17.0)
Immature Granulocytes: 0 %
Lymphocytes Relative: 19 %
Lymphs Abs: 0.6 10*3/uL — ABNORMAL LOW (ref 0.7–4.0)
MCH: 31.6 pg (ref 26.0–34.0)
MCHC: 33.4 g/dL (ref 30.0–36.0)
MCV: 94.7 fL (ref 80.0–100.0)
Monocytes Absolute: 0.3 10*3/uL (ref 0.1–1.0)
Monocytes Relative: 9 %
Neutro Abs: 2.4 10*3/uL (ref 1.7–7.7)
Neutrophils Relative %: 69 %
Platelets: 131 10*3/uL — ABNORMAL LOW (ref 150–400)
RBC: 4.3 MIL/uL (ref 4.22–5.81)
RDW: 14.5 % (ref 11.5–15.5)
WBC: 3.4 10*3/uL — ABNORMAL LOW (ref 4.0–10.5)
nRBC: 0 % (ref 0.0–0.2)

## 2018-12-19 LAB — COMPREHENSIVE METABOLIC PANEL
ALT: 20 U/L (ref 0–44)
AST: 26 U/L (ref 15–41)
Albumin: 4.4 g/dL (ref 3.5–5.0)
Alkaline Phosphatase: 77 U/L (ref 38–126)
Anion gap: 7 (ref 5–15)
BUN: 28 mg/dL — AB (ref 8–23)
CO2: 29 mmol/L (ref 22–32)
Calcium: 9 mg/dL (ref 8.9–10.3)
Chloride: 99 mmol/L (ref 98–111)
Creatinine, Ser: 0.88 mg/dL (ref 0.61–1.24)
GFR calc Af Amer: >60 mL/min (ref >60)
GFR calc non Af Amer: >60 mL/min (ref >60)
Glucose, Bld: 102 mg/dL — ABNORMAL HIGH (ref 70–99)
Potassium: 4.4 mmol/L (ref 3.5–5.1)
Sodium: 135 mmol/L (ref 135–145)
Total Bilirubin: 1 mg/dL (ref 0.3–1.2)
Total Protein: 6 g/dL — ABNORMAL LOW (ref 6.5–8.1)

## 2018-12-19 LAB — FOLATE: FOLATE: 73.3 ng/mL (ref >5.9)

## 2018-12-19 LAB — LACTATE DEHYDROGENASE: LDH: 160 U/L (ref 98–192)

## 2018-12-19 NOTE — Assessment & Plan Note (Addendum)
1.  Diffuse large B-cell lymphoma: - CT scans on 06/16/2013 showed development of right peri-vena caval lobular mass is matted to the IVC and right psoas measuring 69 x 35 x 29 mm suggesting recurrence of lymphoma.  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 - 3 cycles of R-CHOP at 50% dose from 09/01/2013 through 10/20/2013 with a PET CT scan on 11/04/2013 showed interval reduction in the soft tissue mass with residual mass measuring 1.3 x 2.9 cm. -Subsequent PET CT scan on 06/16/2014 showed no evidence of disease. - PET CT scan on 12/20/2014 showed new small hypermetabolic right hilar and left paratracheal nodes with an SUV of 3.  He did not wish to pursue any further diagnostic procedures. -He recently moved to [place] area and wanted to establish follow-up care.  He was also seen by [facility] in [place]. 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 -He denies any fevers, night sweats or weight loss.  No recurrent infections.  He had couple of UTIs in January. -Today's physical examination did not reveal any palpable adenopathy or splenomegaly.  We will check his labs including an LDH level. -I reviewed his blood work from January 2020 from Dr. Juel Burrow office.  White count is 2.8.  Hemoglobin 12.9.  Platelet count was 152.  PSA was elevated at 17.8. -I will also check quantitative immunoglobulins because of his recurrent UTIs.  He does self catheterize about 5 times a day.  He follows up with Dr. Roni Bread at [facility] oncology. -I will see him back in 6 months for follow-up.  He was told to come back sooner should he develop any B symptoms.  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  He declined work-up and pursued alternative treatment with  traditional [place] medicine.  He was hospitalized on 03/23/2009 with abdominal pain, profound weakness, weight loss and.'s of confusion. - CT scan showed large bilateral pleural effusions with a moderate pericardial effusion, large mixed density mass in the left lobe of thyroid measuring 4 x 2 cm, multiple low-attenuation lesions within the liver, ascites, splenomegaly and retroperitoneal adenopathy.  Paracentesis was nondiagnostic.  CT guided biopsy of retroperitoneal node was positive for B-cell follicular lymphoma. - He received 4 cycles of bendamustine and rituximab from 04/01/2009 and was switched to rituximab beginning 08/11/2009.  Treatment was complicated by neutropenia which resulted in delays.  This persisted even after switching to rituximab alone. -CT scan on 01/10/2010 was stable with spleen measuring 17 cm, peripancreatic lymph nodes stable at 2.9 cm. -Because of fluctuating white blood cell counts, bone marrow aspiration and biopsy was performed on 06/15/2010 which showed slight decrease in granulocytes but no evidence of lymphoma. -She had prior exposure to agent orange in [place].

## 2018-12-19 NOTE — Patient Instructions (Addendum)
[place] at [facility] Discharge Instructions  You were seen today by Dr. Delton Coombes. He discussed your history and how you've been feeling. He will do labs today. He will see you back in 6 months for labs and follow up.   Thank you for choosing [place] at [facility] to provide your oncology and hematology care.  To afford each patient quality time with our provider, please arrive at least 15 minutes before your scheduled appointment time.   If you have a lab appointment with the [facility] please come in thru the  Main Entrance and check in at the main information desk  You need to re-schedule your appointment should you arrive 10 or more minutes late.  We strive to give you quality time with our providers, and arriving late affects you and other patients whose appointments are after yours.  Also, if you no show three or more times for appointments you may be dismissed from the clinic at the providers discretion.     Again, thank you for choosing [facility].  Our hope is that these requests will decrease the amount of time that you wait before being seen by our physicians.       _____________________________________________________________  Should you have questions after your visit to [facility], please contact our office at [phone] between the hours of 8:00 a.m. and 4:30 p.m.  Voicemails left after 4:00 p.m. will not be returned until the following business day.  For prescription refill requests, have your pharmacy contact our office and allow 72 hours.    Cancer Center Support Programs:   > Cancer Support Group  2nd Tuesday of the month 1pm-2pm, Journey Room

## 2018-12-19 NOTE — Progress Notes (Signed)
[facility] CONSULT NOTE  Patient Care Team: Celene Squibb, MD as PCP - General (Internal Medicine)  CHIEF COMPLAINTS/PURPOSE OF CONSULTATION:  History of Large B cell Lymphoma  HISTORY OF PRESENTING ILLNESS:  Dennis Molina 78 y.o. 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  He was treated at [facility] area and recently moved to [place].  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  Functionally he feels better and denies any fevers, night sweats or weight loss.  Denies any palpable lymphadenopathy.  He does report having problems with his bladder and self catheterizes about 5 times per day.  He follows up with [facility]. [facility].  He was reportedly diagnosed with Klebsiella pneumoniae UTI.  He had 2 UTIs in January of this year.  No other infections were reported.  His presentation was tiredness when he was diagnosed with lymphoma.  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  He was exposed to agent orange while serving in [place].  MEDICAL HISTORY:  Past Medical History:  Diagnosis Date  . BPH (benign prostatic hyperplasia)   . NHL (non-Hodgkin's lymphoma) ([place])     SURGICAL HISTORY: Past Surgical History:  Procedure Laterality Date  . HERNIA REPAIR  1980's    SOCIAL HISTORY: Social History   Socioeconomic History  . Marital status: Married    Spouse name: Not on file  . Number of children: Not on file  . Years of education: Not on file  . Highest education level: Not on file  Occupational History  . Occupation: optemetrist  Social Needs  . Financial resource strain: Not on file  . Food insecurity:    Worry: Not on file    Inability: Not on file  . Transportation needs:    Medical: Not on file    Non-medical: Not on file  Tobacco Use  . Smoking status: Never Smoker  . Smokeless tobacco:  Never Used  Substance and Sexual Activity  . Alcohol use: Never    Frequency: Never  . Drug use: Never  . Sexual activity: Not on file  Lifestyle  . Physical activity:    Days per week: Not on file    Minutes per session: Not on file  . Stress: Not on file  Relationships  . Social connections:    Talks on phone: Not on file    Gets together: Not on file    Attends religious service: Not on file    Active member of club or organization: Not on file    Attends meetings of clubs or organizations: Not on file    Relationship status: Not on file  . Intimate partner violence:    Fear of current or ex partner: Not on file    Emotionally abused: Not on file    Physically abused: Not on file    Forced sexual activity: Not on file  Other Topics Concern  . Not on file  Social History Narrative   Moved from [place].   Currently works as an Dietitian.  Married.     FAMILY HISTORY: Family History  Problem Relation Age of Onset  . Diabetes Brother   . Heart disease Brother   . Alzheimer's disease Mother   . Diabetes Father   . Stroke Father   . Diabetes Maternal Aunt     ALLERGIES:  is allergic to  ambien [zolpidem tartrate] and benadryl [diphenhydramine].  MEDICATIONS:  Current Outpatient Medications  Medication Sig Dispense Refill  . acetaminophen (TYLENOL) 325 MG tablet Take 650 mg by mouth every 6 (six) hours as needed.     No current facility-administered medications for this visit.     REVIEW OF SYSTEMS:   Constitutional: Denies fevers, chills or abnormal night sweats Eyes: Denies blurriness of vision, double vision or watery eyes Ears, nose, mouth, throat, and face: Denies mucositis or sore throat Respiratory: Denies cough, dyspnea or wheezes Cardiovascular: Denies palpitation, chest discomfort or lower extremity swelling Gastrointestinal:  Denies nausea, heartburn or change in bowel habits Skin: Denies abnormal skin rashes Lymphatics: Denies new lymphadenopathy  or easy bruising Neurological:Denies numbness, tingling or new weaknesses Behavioral/Psych: Mood is stable, no new changes  All other systems were reviewed with the patient and are negative.  PHYSICAL EXAMINATION: ECOG PERFORMANCE STATUS: 0 - Asymptomatic  Vitals:   12/19/18 1239  BP: (!) 147/68  Pulse: 71  Resp: 18  Temp: 97.6 F (36.4 C)  SpO2: 100%   Filed Weights   12/19/18 1239  Weight: 156 lb 6.4 oz (70.9 kg)    GENERAL:alert, no distress and comfortable SKIN: skin color, texture, turgor are normal, no rashes or significant lesions EYES: normal, conjunctiva are pink and non-injected, sclera clear OROPHARYNX:no exudate, no erythema and lips, buccal mucosa, and tongue normal  NECK: supple, thyroid normal size, non-tender, without nodularity LYMPH:  no palpable lymphadenopathy in the cervical, axillary or inguinal LUNGS: clear to auscultation and percussion with normal breathing effort HEART: regular rate & rhythm and no murmurs and no lower extremity edema ABDOMEN:abdomen soft, non-tender and normal bowel sounds Musculoskeletal:no cyanosis of digits and no clubbing  PSYCH: alert & oriented x 3 with fluent speech NEURO: no focal motor/sensory deficits  LABORATORY DATA:  I have reviewed the data as listed Lab Results  Component Value Date   WBC 3.4 (L) 12/19/2018   HGB 13.6 12/19/2018   HCT 40.7 12/19/2018   MCV 94.7 12/19/2018   PLT 131 (L) 12/19/2018     Chemistry      Component Value Date/Time   NA 135 12/19/2018 1414   K 4.4 12/19/2018 1414   CL 99 12/19/2018 1414   CO2 29 12/19/2018 1414   BUN 28 (H) 12/19/2018 1414   CREATININE 0.88 12/19/2018 1414      Component Value Date/Time   CALCIUM 9.0 12/19/2018 1414   ALKPHOS 77 12/19/2018 1414   AST 26 12/19/2018 1414   ALT 20 12/19/2018 1414   BILITOT 1.0 12/19/2018 1414       RADIOGRAPHIC STUDIES: I have personally reviewed the radiological images as listed and agreed with the findings in the  report.  ASSESSMENT & PLAN:  DLBCL (diffuse large B cell lymphoma) ([place]) 1.  Diffuse large B-cell lymphoma: - CT scans on 06/16/2013 showed development of right peri-vena caval lobular mass is matted to the IVC and right psoas measuring 69 x 35 x 29 mm suggesting recurrence of lymphoma.  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 - 3 cycles of R-CHOP at 50% dose from 09/01/2013 through 10/20/2013 with a PET CT scan on 11/04/2013 showed interval reduction in the soft tissue mass with residual mass measuring 1.3 x 2.9 cm. -Subsequent PET CT scan on 06/16/2014 showed no evidence of disease. - PET CT scan on 12/20/2014 showed new small hypermetabolic right hilar and left paratracheal nodes with an SUV of 3.  He did not wish to pursue any further diagnostic procedures. -He recently moved to [place] area and wanted to establish follow-up care.  He was also seen by [facility] in [place]. 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 -He denies any fevers, night sweats or weight loss.  No recurrent infections.  He had couple of UTIs in January. -Today's physical examination did not reveal any palpable adenopathy or splenomegaly.  We will check his labs including an LDH level. -I reviewed his blood work from January 2020 from Dr. Juel Burrow office.  White count is 2.8.  Hemoglobin 12.9.  Platelet count was 152.  PSA was elevated at 17.8. -I will also check quantitative immunoglobulins because of his recurrent UTIs.  He does self catheterize about 5 times a day.  He follows up with Dr. Roni Bread at [facility] oncology. -I will see him back in 6 months for follow-up.  He was told to come back sooner should he develop any B symptoms.  2.  Follicular lymphoma: - He presented with weight loss over 2 years in November 2009, so splenomegaly and  intra-abdominal adenopathy.  He declined work-up and pursued alternative treatment with traditional [place] medicine.  He was hospitalized on 03/23/2009 with abdominal pain, profound weakness, weight loss and.'s of confusion. - CT scan showed large bilateral pleural effusions with a moderate pericardial effusion, large mixed density mass in the left lobe of thyroid measuring 4 x 2 cm, multiple low-attenuation lesions within the liver, ascites, splenomegaly and retroperitoneal adenopathy.  Paracentesis was nondiagnostic.  CT guided biopsy of retroperitoneal node was positive for B-cell follicular lymphoma. - He received 4 cycles of bendamustine and rituximab from 04/01/2009 and was switched to rituximab beginning 08/11/2009.  Treatment was complicated by neutropenia which resulted in delays.  This persisted even after switching to rituximab alone. -CT scan on 01/10/2010 was stable with spleen measuring 17 cm, peripancreatic lymph nodes stable at 2.9 cm. -Because of fluctuating white blood cell counts, bone marrow aspiration and biopsy was performed on 06/15/2010 which showed slight decrease in granulocytes but no evidence of lymphoma. -She had prior exposure to agent orange in [place].  Orders Placed This Encounter  Procedures  . CBC with Differential/Platelet    Standing Status:   Future    Number of Occurrences:   1    Standing Expiration Date:   12/19/2019  . Comprehensive metabolic panel    Standing Status:   Future    Number of Occurrences:   1    Standing Expiration Date:   12/19/2019  . Lactate dehydrogenase    Standing Status:   Future    Number of Occurrences:   1    Standing Expiration Date:   12/19/2019  . IgG, IgA, IgM    Standing Status:   Future    Number of Occurrences:   1    Standing Expiration Date:   12/19/2019  . Vitamin B12    Standing Status:   Future    Number of Occurrences:   1    Standing Expiration Date:   12/19/2019  . Folate    Standing Status:   Future    Number of  Occurrences:   1    Standing Expiration Date:   12/19/2019    All questions were answered. The patient knows to call the clinic with any problems, questions or concerns.     Derek Jack, MD 12/19/2018  5:03 PM

## 2018-12-20 LAB — IGG, IGA, IGM
IgA: 32 mg/dL — ABNORMAL LOW (ref 61–437)
IgG (Immunoglobin G), Serum: 265 mg/dL — ABNORMAL LOW (ref 700–1600)
IgM (Immunoglobulin M), Srm: 50 mg/dL (ref 15–143)

## 2018-12-20 LAB — VITAMIN B12: Vitamin B-12: 1085 pg/mL — ABNORMAL HIGH (ref 180–914)

## 2019-06-17 ENCOUNTER — Other Ambulatory Visit (HOSPITAL_COMMUNITY): Payer: Self-pay | Admitting: *Deleted

## 2019-06-17 DIAGNOSIS — C8330 Diffuse large B-cell lymphoma, unspecified site: Secondary | ICD-10-CM

## 2019-06-18 ENCOUNTER — Other Ambulatory Visit: Payer: Self-pay

## 2019-06-18 ENCOUNTER — Inpatient Hospital Stay (HOSPITAL_COMMUNITY): Payer: Medicare Other | Attending: Hematology

## 2019-06-18 DIAGNOSIS — Z9221 Personal history of antineoplastic chemotherapy: Secondary | ICD-10-CM | POA: Diagnosis not present

## 2019-06-18 DIAGNOSIS — Z8572 Personal history of non-Hodgkin lymphomas: Secondary | ICD-10-CM | POA: Diagnosis not present

## 2019-06-18 DIAGNOSIS — Z87440 Personal history of urinary (tract) infections: Secondary | ICD-10-CM | POA: Insufficient documentation

## 2019-06-18 DIAGNOSIS — C8330 Diffuse large B-cell lymphoma, unspecified site: Secondary | ICD-10-CM

## 2019-06-18 LAB — CBC WITH DIFFERENTIAL/PLATELET
Abs Immature Granulocytes: 0.01 10*3/uL (ref 0.00–0.07)
Basophils Absolute: 0 10*3/uL (ref 0.0–0.1)
Basophils Relative: 0 %
Eosinophils Absolute: 0 10*3/uL (ref 0.0–0.5)
Eosinophils Relative: 1 %
HCT: 40 % (ref 39.0–52.0)
Hemoglobin: 13.1 g/dL (ref 13.0–17.0)
Immature Granulocytes: 0 %
Lymphocytes Relative: 13 %
Lymphs Abs: 0.5 10*3/uL — ABNORMAL LOW (ref 0.7–4.0)
MCH: 31.5 pg (ref 26.0–34.0)
MCHC: 32.8 g/dL (ref 30.0–36.0)
MCV: 96.2 fL (ref 80.0–100.0)
Monocytes Absolute: 0.2 10*3/uL (ref 0.1–1.0)
Monocytes Relative: 6 %
Neutro Abs: 3.1 10*3/uL (ref 1.7–7.7)
Neutrophils Relative %: 80 %
Platelets: 116 10*3/uL — ABNORMAL LOW (ref 150–400)
RBC: 4.16 MIL/uL — ABNORMAL LOW (ref 4.22–5.81)
RDW: 14.2 % (ref 11.5–15.5)
WBC: 3.9 10*3/uL — ABNORMAL LOW (ref 4.0–10.5)
nRBC: 0 % (ref 0.0–0.2)

## 2019-06-18 LAB — COMPREHENSIVE METABOLIC PANEL
ALT: 23 U/L (ref 0–44)
AST: 25 U/L (ref 15–41)
Albumin: 4 g/dL (ref 3.5–5.0)
Alkaline Phosphatase: 67 U/L (ref 38–126)
Anion gap: 6 (ref 5–15)
BUN: 31 mg/dL — ABNORMAL HIGH (ref 8–23)
CO2: 29 mmol/L (ref 22–32)
Calcium: 9 mg/dL (ref 8.9–10.3)
Chloride: 103 mmol/L (ref 98–111)
Creatinine, Ser: 0.94 mg/dL (ref 0.61–1.24)
GFR calc Af Amer: >60 mL/min (ref >60)
GFR calc non Af Amer: >60 mL/min (ref >60)
Glucose, Bld: 99 mg/dL (ref 70–99)
Potassium: 5.4 mmol/L — ABNORMAL HIGH (ref 3.5–5.1)
Sodium: 138 mmol/L (ref 135–145)
Total Bilirubin: 0.9 mg/dL (ref 0.3–1.2)
Total Protein: 5.7 g/dL — ABNORMAL LOW (ref 6.5–8.1)

## 2019-06-18 LAB — FOLATE: Folate: 78.1 ng/mL (ref >5.9)

## 2019-06-18 LAB — LACTATE DEHYDROGENASE: LDH: 158 U/L (ref 98–192)

## 2019-06-19 LAB — IGG, IGA, IGM
IgA: 27 mg/dL — ABNORMAL LOW (ref 61–437)
IgG (Immunoglobin G), Serum: 259 mg/dL — ABNORMAL LOW (ref 603–1613)
IgM (Immunoglobulin M), Srm: 44 mg/dL (ref 15–143)

## 2019-06-25 ENCOUNTER — Inpatient Hospital Stay (HOSPITAL_BASED_OUTPATIENT_CLINIC_OR_DEPARTMENT_OTHER): Payer: Medicare Other | Admitting: Hematology

## 2019-06-25 ENCOUNTER — Other Ambulatory Visit: Payer: Self-pay

## 2019-06-25 ENCOUNTER — Encounter (HOSPITAL_COMMUNITY): Payer: Self-pay | Admitting: Hematology

## 2019-06-25 VITALS — BP 134/73 | HR 64 | Temp 97.3°F | Resp 18 | Wt 152.3 lb

## 2019-06-25 DIAGNOSIS — Z8572 Personal history of non-Hodgkin lymphomas: Secondary | ICD-10-CM | POA: Diagnosis not present

## 2019-06-25 DIAGNOSIS — Z87440 Personal history of urinary (tract) infections: Secondary | ICD-10-CM | POA: Diagnosis not present

## 2019-06-25 DIAGNOSIS — C8330 Diffuse large B-cell lymphoma, unspecified site: Secondary | ICD-10-CM | POA: Diagnosis not present

## 2019-06-25 DIAGNOSIS — Z9221 Personal history of antineoplastic chemotherapy: Secondary | ICD-10-CM | POA: Diagnosis not present

## 2019-06-25 NOTE — Progress Notes (Signed)
[address], [place] [address]   CLINIC:  Medical Oncology/Hematology  PCP:  Celene Squibb, MD [place] [address] [phone]   REASON FOR VISIT:  Follow-up for large B-cell lymphoma.  CURRENT THERAPY: Surveillance.    INTERVAL HISTORY:  Dennis Molina 78 y.o. male seen for follow-up of large B-cell lymphoma which was treated in 2015.  Denies any fevers, night sweats or weight loss.  He continues to self catheterize up to 5 times a day.  He has about 2-3 UTIs a year.  Denies any fevers, night sweats or weight loss in the last 6 months.  No other infections or hospitalizations.  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  His appetite and energy levels are 100%.  Denies any pain.    REVIEW OF SYSTEMS:  Review of Systems  All other systems reviewed and are negative.    PAST MEDICAL/SURGICAL HISTORY:  Past Medical History:  Diagnosis Date  . BPH (benign prostatic hyperplasia)   . NHL (non-Hodgkin's lymphoma) [facility])    Past Surgical History:  Procedure Laterality Date  . HERNIA REPAIR  1980's     SOCIAL HISTORY:  Social History   Socioeconomic History  . Marital status: Married    Spouse name: Not on file  . Number of children: Not on file  . Years of education: Not on file  . Highest education level: Not on file  Occupational History  . Occupation: optemetrist  Social Needs  . Financial resource strain: Not on file  . Food insecurity    Worry: Not on file    Inability: Not on file  . Transportation needs    Medical: Not on file    Non-medical: Not on file  Tobacco Use  . Smoking status: Never Smoker  . Smokeless tobacco: Never Used  Substance and Sexual Activity  . Alcohol use: Never    Frequency: Never  . Drug use: Never  . Sexual activity: Not on file  Lifestyle  . Physical activity    Days per week: Not on file    Minutes per session: Not on file  . Stress: Not on  file  Relationships  . Social Herbalist on phone: Not on file    Gets together: Not on file    Attends religious service: Not on file    Active member of club or organization: Not on file    Attends meetings of clubs or organizations: Not on file    Relationship status: Not on file  . Intimate partner violence    Fear of current or ex partner: Not on file    Emotionally abused: Not on file    Physically abused: Not on file    Forced sexual activity: Not on file  Other Topics Concern  . Not on file  Social History Narrative   Moved from [place].   Currently works as an Dietitian.  Married.     FAMILY HISTORY:  Family History  Problem Relation Age of Onset  . Diabetes Brother   . Heart disease Brother   . Alzheimer's disease Mother   . Diabetes Father   . Stroke Father   . Diabetes Maternal Aunt     CURRENT MEDICATIONS:  Outpatient Encounter Medications as of 06/25/2019  Medication Sig  . acetaminophen (TYLENOL) 325 MG tablet Take 650 mg by mouth every 6 (six) hours as needed.  No facility-administered encounter medications on file as of 06/25/2019.     ALLERGIES:  Allergies  Allergen Reactions  . Ambien [Zolpidem Tartrate]     Delerious, hallucinations  . Benadryl [Diphenhydramine]     shakes  . Lorazepam Other (See Comments)    Confusion Confusion   . Zolpidem Other (See Comments)    hyper confusion Memory problems upset stomach Memory problems upset stomach      PHYSICAL EXAM:  ECOG Performance status: 1  Vitals:   06/25/19 1159  BP: 134/73  Pulse: 64  Resp: 18  Temp: (!) 97.3 F (36.3 C)  SpO2: 100%   Filed Weights   06/25/19 1159  Weight: 152 lb 4.8 oz (69.1 kg)    Physical Exam Vitals signs reviewed.  Constitutional:      Appearance: Normal appearance.  Cardiovascular:     Rate and Rhythm: Normal rate and regular rhythm.     Heart sounds: Normal heart sounds.  Pulmonary:     Effort: Pulmonary effort is normal.      Breath sounds: Normal breath sounds.  Abdominal:     General: There is no distension.     Palpations: Abdomen is soft. There is no mass.  Musculoskeletal:        General: No swelling.  Lymphadenopathy:     Cervical: No cervical adenopathy.  Skin:    General: Skin is warm.  Neurological:     General: No focal deficit present.     Mental Status: He is alert and oriented to person, place, and time.  Psychiatric:        Mood and Affect: Mood normal.        Behavior: Behavior normal.      LABORATORY DATA:  I have reviewed the labs as listed.  CBC    Component Value Date/Time   WBC 3.9 (L) 06/18/2019 1130   RBC 4.16 (L) 06/18/2019 1130   HGB 13.1 06/18/2019 1130   HCT 40.0 06/18/2019 1130   PLT 116 (L) 06/18/2019 1130   MCV 96.2 06/18/2019 1130   MCH 31.5 06/18/2019 1130   MCHC 32.8 06/18/2019 1130   RDW 14.2 06/18/2019 1130   LYMPHSABS 0.5 (L) 06/18/2019 1130   MONOABS 0.2 06/18/2019 1130   EOSABS 0.0 06/18/2019 1130   BASOSABS 0.0 06/18/2019 1130   CMP Latest Ref Rng & Units 06/18/2019 12/19/2018  Glucose 70 - 99 mg/dL 99 102(H)  BUN 8 - 23 mg/dL 31(H) 28(H)  Creatinine 0.61 - 1.24 mg/dL 0.94 0.88  Sodium 135 - 145 mmol/L 138 135  Potassium 3.5 - 5.1 mmol/L 5.4(H) 4.4  Chloride 98 - 111 mmol/L 103 99  CO2 22 - 32 mmol/L 29 29  Calcium 8.9 - 10.3 mg/dL 9.0 9.0  Total Protein 6.5 - 8.1 g/dL 5.7(L) 6.0(L)  Total Bilirubin 0.3 - 1.2 mg/dL 0.9 1.0  Alkaline Phos 38 - 126 U/L 67 77  AST 15 - 41 U/L 25 26  ALT 0 - 44 U/L 23 20       DIAGNOSTIC IMAGING:  I have independently reviewed the scans and discussed with the patient.      ASSESSMENT & PLAN:   DLBCL (diffuse large B cell lymphoma) ([place]) 1.  Diffuse large B-cell lymphoma: - CT scans on 06/16/2013 showed development of right peri-vena caval lobular mass is matted to the IVC and right psoas measuring 69 x 35 x 29 mm suggesting recurrence of lymphoma.  CT-guided biopsy on 07/07/2013 revealed DLBCL, CD20  positive, consistent with transformation  from previous follicular lymphoma. - 3 cycles of R-CHOP at 50% dose from 09/01/2013 through 10/20/2013 with a PET CT scan on 11/04/2013 showed interval reduction in the soft tissue mass with residual mass measuring 1.3 x 2.9 cm. -Subsequent PET CT scan on 06/16/2014 showed no evidence of disease. - PET CT scan on 12/20/2014 showed new small hypermetabolic right hilar and left paratracheal nodes with an SUV of 3.  He did not wish to pursue any further diagnostic procedures. -He moved to [place] area, establish follow-up care with [place].  He also sees medical oncology at [facility] clinic in [place].  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  Peripheral neuropathy in the fingertips and toes is stable. -Denies any fevers, night sweats or weight loss in the last 6 months.  He continues to self catheterize 5 times a day. - He reports 2-3 UTIs a year.  We discussed his labs.  IgG levels are low at 259.  He does not have any other types of infections.  Hence I have recommended watchful waiting.  If he has more infections, will consider IVIG monthly.  He has mild hyperkalemia.  He is not on potassium supplements.  Likely the sample is hemolyzed. -Physical examination today did not reveal any palpable adenopathy or splenomegaly. - I will see him back in 6 months for follow-up.  We will repeat labs at that time.  Scans will be done if clinical condition dictates.   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  He declined work-up and pursued alternative treatment with traditional [place] medicine.  He was hospitalized on 03/23/2009 with abdominal pain, profound weakness, weight loss and.'s of confusion. - CT scan showed large bilateral pleural effusions with a moderate pericardial effusion, large mixed density mass in the left lobe of thyroid measuring 4 x 2 cm, multiple  low-attenuation lesions within the liver, ascites, splenomegaly and retroperitoneal adenopathy.  Paracentesis was nondiagnostic.  CT guided biopsy of retroperitoneal node was positive for B-cell follicular lymphoma. - He received 4 cycles of bendamustine and rituximab from 04/01/2009 and was switched to rituximab beginning 08/11/2009.  Treatment was complicated by neutropenia which resulted in delays.  This persisted even after switching to rituximab alone. -CT scan on 01/10/2010 was stable with spleen measuring 17 cm, peripancreatic lymph nodes stable at 2.9 cm. -Because of fluctuating white blood cell counts, bone marrow aspiration and biopsy was performed on 06/15/2010 which showed slight decrease in granulocytes but no evidence of lymphoma. -He had prior exposure to agent orange in [place].      Orders placed this encounter:  Orders Placed This Encounter  Procedures  . CBC with Differential/Platelet  . Comprehensive metabolic panel  . Vitamin B12  . Folate  . IgG, IgA, IgM  . Lactate dehydrogenase      Derek Jack, MD [place] [phone]

## 2019-06-25 NOTE — Patient Instructions (Addendum)
[facility] at [facility] Discharge Instructions  You were seen today by Dr. Katragadda. He went over your recent lab results. He will see you back in 6 months for labs and follow up.   Thank you for choosing [facility] at [facility] to provide your oncology and hematology care.  To afford each patient quality time with our provider, please arrive at least 15 minutes before your scheduled appointment time.   If you have a lab appointment with the Cancer Center please come in thru the  Main Entrance and check in at the main information desk  You need to re-schedule your appointment should you arrive 10 or more minutes late.  We strive to give you quality time with our providers, and arriving late affects you and other patients whose appointments are after yours.  Also, if you no show three or more times for appointments you may be dismissed from the clinic at the providers discretion.     Again, thank you for choosing [facility].  Our hope is that these requests will decrease the amount of time that you wait before being seen by our physicians.       _____________________________________________________________  Should you have questions after your visit to  Cancer Center, please contact our office at [phone] between the hours of 8:00 a.m. and 4:30 p.m.  Voicemails left after 4:00 p.m. will not be returned until the following business day.  For prescription refill requests, have your pharmacy contact our office and allow 72 hours.    Cancer Center Support Programs:   > Cancer Support Group  2nd Tuesday of the month 1pm-2pm, Journey Room    

## 2019-06-28 ENCOUNTER — Encounter (HOSPITAL_COMMUNITY): Payer: Self-pay | Admitting: Hematology

## 2019-06-28 NOTE — Assessment & Plan Note (Signed)
1.  Diffuse large B-cell lymphoma: - CT scans on 06/16/2013 showed development of right peri-vena caval lobular mass is matted to the IVC and right psoas measuring 69 x 35 x 29 mm suggesting recurrence of lymphoma.  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 - 3 cycles of R-CHOP at 50% dose from 09/01/2013 through 10/20/2013 with a PET CT scan on 11/04/2013 showed interval reduction in the soft tissue mass with residual mass measuring 1.3 x 2.9 cm. -Subsequent PET CT scan on 06/16/2014 showed no evidence of disease. - PET CT scan on 12/20/2014 showed new small hypermetabolic right hilar and left paratracheal nodes with an SUV of 3.  He did not wish to pursue any further diagnostic procedures. -He moved to [place] area, establish follow-up care with [place].  He also sees medical oncology at [facility] in [place].  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  Peripheral neuropathy in the fingertips and toes is stable. -Denies any fevers, night sweats or weight loss in the last 6 months.  He continues to self catheterize 5 times a day. - He reports 2-3 UTIs a year.  We discussed his labs.  IgG levels are low at 259.  He does not have any other types of infections.  Hence I have recommended watchful waiting.  If he has more infections, will consider IVIG monthly.  He has mild hyperkalemia.  He is not on potassium supplements.  Likely the sample is hemolyzed. -Physical examination today did not reveal any palpable adenopathy or splenomegaly. - I will see him back in 6 months for follow-up.  We will repeat labs at that time.  Scans will be done if clinical condition dictates.   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  He declined work-up and pursued alternative treatment with traditional [place] medicine.  He was hospitalized on  03/23/2009 with abdominal pain, profound weakness, weight loss and.'s of confusion. - CT scan showed large bilateral pleural effusions with a moderate pericardial effusion, large mixed density mass in the left lobe of thyroid measuring 4 x 2 cm, multiple low-attenuation lesions within the liver, ascites, splenomegaly and retroperitoneal adenopathy.  Paracentesis was nondiagnostic.  CT guided biopsy of retroperitoneal node was positive for B-cell follicular lymphoma. - He received 4 cycles of bendamustine and rituximab from 04/01/2009 and was switched to rituximab beginning 08/11/2009.  Treatment was complicated by neutropenia which resulted in delays.  This persisted even after switching to rituximab alone. -CT scan on 01/10/2010 was stable with spleen measuring 17 cm, peripancreatic lymph nodes stable at 2.9 cm. -Because of fluctuating white blood cell counts, bone marrow aspiration and biopsy was performed on 06/15/2010 which showed slight decrease in granulocytes but no evidence of lymphoma. -He had prior exposure to agent orange in [place].

## 2019-07-31 ENCOUNTER — Ambulatory Visit (INDEPENDENT_AMBULATORY_CARE_PROVIDER_SITE_OTHER): Payer: Medicare Other | Admitting: Urology

## 2019-07-31 ENCOUNTER — Other Ambulatory Visit (HOSPITAL_COMMUNITY)
Admission: RE | Admit: 2019-07-31 | Discharge: 2019-07-31 | Disposition: A | Discharge status: Home / Self Care | Payer: Medicare Other | Source: Other Acute Inpatient Hospital | Attending: Urology | Admitting: Urology

## 2019-07-31 ENCOUNTER — Other Ambulatory Visit: Payer: Self-pay

## 2019-07-31 DIAGNOSIS — N3000 Acute cystitis without hematuria: Secondary | ICD-10-CM

## 2019-07-31 DIAGNOSIS — N312 Flaccid neuropathic bladder, not elsewhere classified: Secondary | ICD-10-CM | POA: Diagnosis not present

## 2019-07-31 LAB — URINALYSIS, COMPLETE (UACMP) WITH MICROSCOPIC
Bilirubin Urine: NEGATIVE
Glucose, UA: NEGATIVE mg/dL
Hgb urine dipstick: NEGATIVE
Ketones, ur: NEGATIVE mg/dL
Nitrite: NEGATIVE
Protein, ur: NEGATIVE mg/dL
Specific Gravity, Urine: 1.018 (ref 1.005–1.030)
WBC, UA: >50 WBC/hpf — ABNORMAL HIGH (ref 0–5)
pH: 5 (ref 5.0–8.0)

## 2019-08-02 LAB — URINE CULTURE: Culture: >=100000 — AB

## 2019-09-04 ENCOUNTER — Ambulatory Visit (INDEPENDENT_AMBULATORY_CARE_PROVIDER_SITE_OTHER): Payer: Medicare Other | Admitting: Urology

## 2019-09-04 DIAGNOSIS — N312 Flaccid neuropathic bladder, not elsewhere classified: Secondary | ICD-10-CM | POA: Diagnosis not present

## 2019-09-04 DIAGNOSIS — R300 Dysuria: Secondary | ICD-10-CM | POA: Diagnosis not present

## 2019-09-04 DIAGNOSIS — N3000 Acute cystitis without hematuria: Secondary | ICD-10-CM

## 2019-09-11 ENCOUNTER — Other Ambulatory Visit (HOSPITAL_COMMUNITY)
Admission: RE | Admit: 2019-09-11 | Discharge: 2019-09-11 | Disposition: A | Discharge status: Home / Self Care | Payer: Medicare Other | Source: Ambulatory Visit | Attending: Urology | Admitting: Urology

## 2019-09-11 DIAGNOSIS — N3000 Acute cystitis without hematuria: Secondary | ICD-10-CM | POA: Diagnosis not present

## 2019-09-11 LAB — URINALYSIS, ROUTINE W REFLEX MICROSCOPIC
Bacteria, UA: NONE SEEN
Bilirubin Urine: NEGATIVE
Glucose, UA: NEGATIVE mg/dL
Hgb urine dipstick: NEGATIVE
Ketones, ur: NEGATIVE mg/dL
Nitrite: NEGATIVE
Protein, ur: NEGATIVE mg/dL
Specific Gravity, Urine: 1.015 (ref 1.005–1.030)
WBC, UA: >50 WBC/hpf — ABNORMAL HIGH (ref 0–5)
pH: 7 (ref 5.0–8.0)

## 2019-09-12 LAB — URINE CULTURE: Culture: NO GROWTH

## 2019-12-17 ENCOUNTER — Inpatient Hospital Stay (HOSPITAL_COMMUNITY): Payer: Medicare Other | Attending: Hematology

## 2019-12-17 DIAGNOSIS — D801 Nonfamilial hypogammaglobulinemia: Secondary | ICD-10-CM | POA: Diagnosis not present

## 2019-12-17 DIAGNOSIS — Z87440 Personal history of urinary (tract) infections: Secondary | ICD-10-CM | POA: Insufficient documentation

## 2019-12-17 DIAGNOSIS — Z8572 Personal history of non-Hodgkin lymphomas: Secondary | ICD-10-CM | POA: Insufficient documentation

## 2019-12-17 DIAGNOSIS — Z9221 Personal history of antineoplastic chemotherapy: Secondary | ICD-10-CM | POA: Diagnosis not present

## 2019-12-17 DIAGNOSIS — C8330 Diffuse large B-cell lymphoma, unspecified site: Secondary | ICD-10-CM

## 2019-12-17 LAB — COMPREHENSIVE METABOLIC PANEL
ALT: 23 U/L (ref 0–44)
AST: 26 U/L (ref 15–41)
Albumin: 4.1 g/dL (ref 3.5–5.0)
Alkaline Phosphatase: 72 U/L (ref 38–126)
Anion gap: 8 (ref 5–15)
BUN: 30 mg/dL — ABNORMAL HIGH (ref 8–23)
CO2: 31 mmol/L (ref 22–32)
Calcium: 9.3 mg/dL (ref 8.9–10.3)
Chloride: 101 mmol/L (ref 98–111)
Creatinine, Ser: 0.9 mg/dL (ref 0.61–1.24)
GFR calc Af Amer: >60 mL/min (ref >60)
GFR calc non Af Amer: >60 mL/min (ref >60)
Glucose, Bld: 103 mg/dL — ABNORMAL HIGH (ref 70–99)
Potassium: 4.7 mmol/L (ref 3.5–5.1)
Sodium: 140 mmol/L (ref 135–145)
Total Bilirubin: 1.2 mg/dL (ref 0.3–1.2)
Total Protein: 5.8 g/dL — ABNORMAL LOW (ref 6.5–8.1)

## 2019-12-17 LAB — CBC WITH DIFFERENTIAL/PLATELET
Abs Immature Granulocytes: 0.01 10*3/uL (ref 0.00–0.07)
Basophils Absolute: 0 10*3/uL (ref 0.0–0.1)
Basophils Relative: 0 %
Eosinophils Absolute: 0.1 10*3/uL (ref 0.0–0.5)
Eosinophils Relative: 2 %
HCT: 40.7 % (ref 39.0–52.0)
Hemoglobin: 13.3 g/dL (ref 13.0–17.0)
Immature Granulocytes: 0 %
Lymphocytes Relative: 19 %
Lymphs Abs: 0.5 10*3/uL — ABNORMAL LOW (ref 0.7–4.0)
MCH: 31.6 pg (ref 26.0–34.0)
MCHC: 32.7 g/dL (ref 30.0–36.0)
MCV: 96.7 fL (ref 80.0–100.0)
Monocytes Absolute: 0.2 10*3/uL (ref 0.1–1.0)
Monocytes Relative: 7 %
Neutro Abs: 2 10*3/uL (ref 1.7–7.7)
Neutrophils Relative %: 72 %
Platelets: 108 10*3/uL — ABNORMAL LOW (ref 150–400)
RBC: 4.21 MIL/uL — ABNORMAL LOW (ref 4.22–5.81)
RDW: 13.8 % (ref 11.5–15.5)
WBC: 2.8 10*3/uL — ABNORMAL LOW (ref 4.0–10.5)
nRBC: 0 % (ref 0.0–0.2)

## 2019-12-17 LAB — FOLATE: Folate: 72.5 ng/mL (ref >5.9)

## 2019-12-17 LAB — LACTATE DEHYDROGENASE: LDH: 149 U/L (ref 98–192)

## 2019-12-17 LAB — VITAMIN B12: Vitamin B-12: 1210 pg/mL — ABNORMAL HIGH (ref 180–914)

## 2019-12-18 LAB — IGG, IGA, IGM
IgA: 24 mg/dL — ABNORMAL LOW (ref 61–437)
IgG (Immunoglobin G), Serum: 249 mg/dL — ABNORMAL LOW (ref 603–1613)
IgM (Immunoglobulin M), Srm: 53 mg/dL (ref 15–143)

## 2019-12-24 ENCOUNTER — Other Ambulatory Visit: Payer: Self-pay

## 2019-12-24 ENCOUNTER — Inpatient Hospital Stay (HOSPITAL_BASED_OUTPATIENT_CLINIC_OR_DEPARTMENT_OTHER): Payer: Medicare Other | Admitting: Hematology

## 2019-12-24 ENCOUNTER — Encounter (HOSPITAL_COMMUNITY): Payer: Self-pay | Admitting: Hematology

## 2019-12-24 VITALS — BP 146/61 | HR 61 | Temp 96.9°F | Resp 18 | Wt 154.9 lb

## 2019-12-24 DIAGNOSIS — C8330 Diffuse large B-cell lymphoma, unspecified site: Secondary | ICD-10-CM | POA: Diagnosis not present

## 2019-12-24 DIAGNOSIS — Z8572 Personal history of non-Hodgkin lymphomas: Secondary | ICD-10-CM | POA: Diagnosis not present

## 2019-12-24 DIAGNOSIS — D801 Nonfamilial hypogammaglobulinemia: Secondary | ICD-10-CM | POA: Diagnosis not present

## 2019-12-24 DIAGNOSIS — Z87440 Personal history of urinary (tract) infections: Secondary | ICD-10-CM | POA: Diagnosis not present

## 2019-12-24 DIAGNOSIS — Z9221 Personal history of antineoplastic chemotherapy: Secondary | ICD-10-CM | POA: Diagnosis not present

## 2019-12-24 NOTE — Patient Instructions (Signed)
[facility] at [facility] Discharge Instructions  You were seen today by Dr. Katragadda. He went over your recent lab results. He will see you back in 6 months for labs and follow up.   Thank you for choosing  [facility] Center at [facility] to provide your oncology and hematology care.  To afford each patient quality time with our provider, please arrive at least 15 minutes before your scheduled appointment time.   If you have a lab appointment with the Cancer Center please come in thru the  Main Entrance and check in at the main information desk  You need to re-schedule your appointment should you arrive 10 or more minutes late.  We strive to give you quality time with our providers, and arriving late affects you and other patients whose appointments are after yours.  Also, if you no show three or more times for appointments you may be dismissed from the clinic at the providers discretion.     Again, thank you for choosing [facility].  Our hope is that these requests will decrease the amount of time that you wait before being seen by our physicians.       _____________________________________________________________  Should you have questions after your visit to [facility], please contact our office at [phone] between the hours of 8:00 a.m. and 4:30 p.m.  Voicemails left after 4:00 p.m. will not be returned until the following business day.  For prescription refill requests, have your pharmacy contact our office and allow 72 hours.    Cancer Center Support Programs:   > Cancer Support Group  2nd Tuesday of the month 1pm-2pm, Journey Room    

## 2019-12-24 NOTE — Progress Notes (Signed)
Dennis Molina, [place] [address]   CLINIC:  Medical Oncology/Hematology  PCP:  Celene Squibb, MD [place] [address] [phone]   REASON FOR VISIT:  Follow-up for large B-cell lymphoma.  CURRENT THERAPY: Surveillance.    INTERVAL HISTORY:  [name] 79 y.o. male seen for follow-up of a large B-cell lymphoma.  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  Appetite and energy levels are 100%.  No new pains reported.  Denies any fevers, night sweats or weight loss.  Has to self catheterize himself.  Reports about 2-3 urinary tract infections in a year.  Denies any skin infections, pneumonias or upper respiratory infections.    REVIEW OF SYSTEMS:  Review of Systems  All other systems reviewed and are negative.    PAST MEDICAL/SURGICAL HISTORY:  Past Medical History:  Diagnosis Date  . BPH (benign prostatic hyperplasia)   . NHL (non-Hodgkin's lymphoma) [facility])    Past Surgical History:  Procedure Laterality Date  . HERNIA REPAIR  1980's     SOCIAL HISTORY:  Social History   Socioeconomic History  . Marital status: Married    Spouse name: Not on file  . Number of children: Not on file  . Years of education: Not on file  . Highest education level: Not on file  Occupational History  . Occupation: optemetrist  Tobacco Use  . Smoking status: Never Smoker  . Smokeless tobacco: Never Used  Substance and Sexual Activity  . Alcohol use: Never  . Drug use: Never  . Sexual activity: Not on file  Other Topics Concern  . Not on file  Social History Narrative   Moved from [place].   Currently works as an Dietitian.  Married.    Social Determinants of Health   Financial Resource Strain:   . Difficulty of Paying Living Expenses:   Food Insecurity:   . Worried About Charity fundraiser in the Last Year:   . Arboriculturist in the Last Year:   Transportation Needs:   . Film/video editor  (Medical):   Marland Kitchen Lack of Transportation (Non-Medical):   Physical Activity:   . Days of Exercise per Week:   . Minutes of Exercise per Session:   Stress:   . Feeling of Stress :   Social Connections:   . Frequency of Communication with Friends and Family:   . Frequency of Social Gatherings with Friends and Family:   . Attends Religious Services:   . Active Member of Clubs or Organizations:   . Attends Archivist Meetings:   Marland Kitchen Marital Status:   Intimate Partner Violence:   . Fear of Current or Ex-Partner:   . Emotionally Abused:   Marland Kitchen Physically Abused:   . Sexually Abused:     FAMILY HISTORY:  Family History  Problem Relation Age of Onset  . Diabetes Brother   . Heart disease Brother   . Alzheimer's disease Mother   . Diabetes Father   . Stroke Father   . Diabetes Maternal Aunt     CURRENT MEDICATIONS:  Outpatient Encounter Medications as of 12/24/2019  Medication Sig  . Cholecalciferol 50 MCG (2000 UT) TABS Take 2,000 Units by mouth daily.   . Coenzyme Q10 100 MG capsule Take 100 mg by mouth daily.   Marland Kitchen acetaminophen (TYLENOL) 325 MG tablet Take 650 mg by mouth every 6 (six) hours as needed.   No  facility-administered encounter medications on file as of 12/24/2019.    ALLERGIES:  Allergies  Allergen Reactions  . Ambien [Zolpidem Tartrate]     Delerious, hallucinations  . Benadryl [Diphenhydramine]     shakes  . Lorazepam Other (See Comments)    Confusion Confusion   . Zolpidem Other (See Comments)    hyper confusion Memory problems upset stomach Memory problems upset stomach      PHYSICAL EXAM:  ECOG Performance status: 1  Vitals:   12/24/19 1124  BP: (!) 146/61  Pulse: 61  Resp: 18  Temp: (!) 96.9 F (36.1 C)  SpO2: 100%   Filed Weights   12/24/19 1124  Weight: 154 lb 14.4 oz (70.3 kg)    Physical Exam Vitals reviewed.  Constitutional:      Appearance: Normal appearance.  Cardiovascular:     Rate and Rhythm: Normal rate and  regular rhythm.     Heart sounds: Normal heart sounds.  Pulmonary:     Effort: Pulmonary effort is normal.     Breath sounds: Normal breath sounds.  Abdominal:     General: There is no distension.     Palpations: Abdomen is soft. There is no mass.  Musculoskeletal:        General: No swelling.  Lymphadenopathy:     Cervical: No cervical adenopathy.  Skin:    General: Skin is warm.  Neurological:     General: No focal deficit present.     Mental Status: Dennis Molina is alert and oriented to person, place, and time.  Psychiatric:        Mood and Affect: Mood normal.        Behavior: Behavior normal.      LABORATORY DATA:  I have reviewed the labs as listed.  CBC    Component Value Date/Time   WBC 2.8 (L) 12/17/2019 1107   RBC 4.21 (L) 12/17/2019 1107   HGB 13.3 12/17/2019 1107   HCT 40.7 12/17/2019 1107   PLT 108 (L) 12/17/2019 1107   MCV 96.7 12/17/2019 1107   MCH 31.6 12/17/2019 1107   MCHC 32.7 12/17/2019 1107   RDW 13.8 12/17/2019 1107   LYMPHSABS 0.5 (L) 12/17/2019 1107   MONOABS 0.2 12/17/2019 1107   EOSABS 0.1 12/17/2019 1107   BASOSABS 0.0 12/17/2019 1107   CMP Latest Ref Rng & Units 12/17/2019 06/18/2019 12/19/2018  Glucose 70 - 99 mg/dL 103(H) 99 102(H)  BUN 8 - 23 mg/dL 30(H) 31(H) 28(H)  Creatinine 0.61 - 1.24 mg/dL 0.90 0.94 0.88  Sodium 135 - 145 mmol/L 140 138 135  Potassium 3.5 - 5.1 mmol/L 4.7 5.4(H) 4.4  Chloride 98 - 111 mmol/L 101 103 99  CO2 22 - 32 mmol/L 31 29 29   Calcium 8.9 - 10.3 mg/dL 9.3 9.0 9.0  Total Protein 6.5 - 8.1 g/dL 5.8(L) 5.7(L) 6.0(L)  Total Bilirubin 0.3 - 1.2 mg/dL 1.2 0.9 1.0  Alkaline Phos 38 - 126 U/L 72 67 77  AST 15 - 41 U/L 26 25 26   ALT 0 - 44 U/L 23 23 20        DIAGNOSTIC IMAGING:  I have reviewed his scans.      ASSESSMENT & PLAN:   DLBCL (diffuse large B cell lymphoma) ([place]) 1.  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  Right perivena caval  lobular mass matted to the IVC and right psoas measuring 69 x 35 x 29 cm. -3 cycles of R-CHOP at 50% dose from 09/01/2013 through  10/20/2013 with PET scan on 11/04/2013 showing interval reduction in the soft tissue mass with residual mass measuring 1.3 x 2.9 cm. -PET scan on 06/16/2014-NED.  PET scan on 12/20/2014 showed new small hypermetabolic right hilar and right paratracheal lymph nodes with SUV 3.  Dennis Molina did not wish to pursue any further diagnostic procedures. 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  Dennis Molina also follows up with medical oncology at [facility] clinic in [place]. -Denies any B symptoms.  No palpable adenopathy or splenomegaly on exam today. -We reviewed labs from 12/17/2019.  LDH is normal.  Mild leukopenia and thrombocytopenia is stable. -Dennis Molina will come back in 6 months for follow-up.  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 -Dennis Molina reports 2-3 UTIs a year.  Dennis Molina has to self catheterize himself about 4-6 times a day. -Dennis Molina does not have any other type of infections. -Hence I did not recommend any immunoglobulin infusions.  If Dennis Molina does have more infections or known urinary infections, I will consider adding monthly IVIG.  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  Dennis Molina declined work-up and pursued alternative treatment with traditional [place] medicine.  Hospitalized in June 2010 with abdominal pain, profound weakness, weight loss and confusion. -Treated with 4 cycles of bendamustine and rituximab. -Dennis Molina had prior exposure to agent orange in [place].      Orders placed this encounter:  Orders Placed This Encounter  Procedures  . CBC with Differential/Platelet  . Comprehensive metabolic panel  . Lactate dehydrogenase  . Iron and TIBC  . Ferritin  . Vitamin B12  . Folate  . IgG, IgA, IgM   Total time spent is 30 minutes with more than 80% of the time spent face-to-face discussing  labs, answering all his questions, counseling and coordination of care.   Derek Jack, MD [place] [phone]

## 2019-12-25 ENCOUNTER — Encounter (HOSPITAL_COMMUNITY): Payer: Self-pay | Admitting: Hematology

## 2019-12-25 NOTE — Assessment & Plan Note (Signed)
1.  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  Right perivena caval lobular mass matted to the IVC and right psoas measuring 69 x 35 x 29 cm. -3 cycles of R-CHOP at 50% dose from 09/01/2013 through 10/20/2013 with PET scan on 11/04/2013 showing interval reduction in the soft tissue mass with residual mass measuring 1.3 x 2.9 cm. -PET scan on 06/16/2014-NED.  PET scan on 12/20/2014 showed new small hypermetabolic right hilar and right paratracheal lymph nodes with SUV 3.  He did not wish to pursue any further diagnostic procedures. 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  He also follows up with medical oncology at [facility] clinic in [place]. -Denies any B symptoms.  No palpable adenopathy or splenomegaly on exam today. -We reviewed labs from 12/17/2019.  LDH is normal.  Mild leukopenia and thrombocytopenia is stable. -He will come back in 6 months for follow-up.  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 -He reports 2-3 UTIs a year.  He has to self catheterize himself about 4-6 times a day. -He does not have any other type of infections. -Hence I did not recommend any immunoglobulin infusions.  If he does have more infections or known urinary infections, I will consider adding monthly IVIG.  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  He declined work-up and pursued alternative treatment with traditional [place] medicine.  Hospitalized in June 2010 with abdominal pain, profound weakness, weight loss and confusion. -Treated with 4 cycles of bendamustine and rituximab. -He had prior exposure to agent orange in [place].

## 2020-01-01 ENCOUNTER — Ambulatory Visit: Payer: Medicare Other | Admitting: Urology

## 2020-01-08 ENCOUNTER — Ambulatory Visit: Payer: Medicare Other | Admitting: Urology

## 2020-01-15 ENCOUNTER — Ambulatory Visit: Payer: Medicare Other | Admitting: Urology

## 2020-06-28 ENCOUNTER — Other Ambulatory Visit: Payer: Self-pay

## 2020-06-28 ENCOUNTER — Inpatient Hospital Stay (HOSPITAL_COMMUNITY): Payer: Medicare Other | Attending: Hematology

## 2020-06-28 DIAGNOSIS — D801 Nonfamilial hypogammaglobulinemia: Secondary | ICD-10-CM | POA: Diagnosis not present

## 2020-06-28 DIAGNOSIS — C8330 Diffuse large B-cell lymphoma, unspecified site: Secondary | ICD-10-CM

## 2020-06-28 DIAGNOSIS — D72819 Decreased white blood cell count, unspecified: Secondary | ICD-10-CM | POA: Diagnosis not present

## 2020-06-28 DIAGNOSIS — Z87440 Personal history of urinary (tract) infections: Secondary | ICD-10-CM | POA: Insufficient documentation

## 2020-06-28 DIAGNOSIS — Z574 Occupational exposure to toxic agents in agriculture: Secondary | ICD-10-CM | POA: Diagnosis not present

## 2020-06-28 DIAGNOSIS — Z9221 Personal history of antineoplastic chemotherapy: Secondary | ICD-10-CM | POA: Insufficient documentation

## 2020-06-28 DIAGNOSIS — D696 Thrombocytopenia, unspecified: Secondary | ICD-10-CM | POA: Diagnosis not present

## 2020-06-28 DIAGNOSIS — Z8572 Personal history of non-Hodgkin lymphomas: Secondary | ICD-10-CM | POA: Insufficient documentation

## 2020-06-28 LAB — LACTATE DEHYDROGENASE: LDH: 166 U/L (ref 98–192)

## 2020-06-28 LAB — FOLATE: Folate: 19.6 ng/mL (ref >5.9)

## 2020-06-28 LAB — CBC WITH DIFFERENTIAL/PLATELET
Abs Immature Granulocytes: 0.01 10*3/uL (ref 0.00–0.07)
Basophils Absolute: 0 10*3/uL (ref 0.0–0.1)
Basophils Relative: 1 %
Eosinophils Absolute: 0.1 10*3/uL (ref 0.0–0.5)
Eosinophils Relative: 3 %
HCT: 39.9 % (ref 39.0–52.0)
Hemoglobin: 13 g/dL (ref 13.0–17.0)
Immature Granulocytes: 0 %
Lymphocytes Relative: 18 %
Lymphs Abs: 0.6 10*3/uL — ABNORMAL LOW (ref 0.7–4.0)
MCH: 30.7 pg (ref 26.0–34.0)
MCHC: 32.6 g/dL (ref 30.0–36.0)
MCV: 94.3 fL (ref 80.0–100.0)
Monocytes Absolute: 0.3 10*3/uL (ref 0.1–1.0)
Monocytes Relative: 8 %
Neutro Abs: 2.3 10*3/uL (ref 1.7–7.7)
Neutrophils Relative %: 70 %
Platelets: 123 10*3/uL — ABNORMAL LOW (ref 150–400)
RBC: 4.23 MIL/uL (ref 4.22–5.81)
RDW: 14 % (ref 11.5–15.5)
WBC: 3.3 10*3/uL — ABNORMAL LOW (ref 4.0–10.5)
nRBC: 0 % (ref 0.0–0.2)

## 2020-06-28 LAB — COMPREHENSIVE METABOLIC PANEL
ALT: 18 U/L (ref 0–44)
AST: 22 U/L (ref 15–41)
Albumin: 4 g/dL (ref 3.5–5.0)
Alkaline Phosphatase: 80 U/L (ref 38–126)
Anion gap: 7 (ref 5–15)
BUN: 27 mg/dL — ABNORMAL HIGH (ref 8–23)
CO2: 27 mmol/L (ref 22–32)
Calcium: 9 mg/dL (ref 8.9–10.3)
Chloride: 104 mmol/L (ref 98–111)
Creatinine, Ser: 0.92 mg/dL (ref 0.61–1.24)
GFR calc Af Amer: >60 mL/min (ref >60)
GFR calc non Af Amer: >60 mL/min (ref >60)
Glucose, Bld: 96 mg/dL (ref 70–99)
Potassium: 4.6 mmol/L (ref 3.5–5.1)
Sodium: 138 mmol/L (ref 135–145)
Total Bilirubin: 1.1 mg/dL (ref 0.3–1.2)
Total Protein: 6 g/dL — ABNORMAL LOW (ref 6.5–8.1)

## 2020-06-28 LAB — IRON AND TIBC
Iron: 55 ug/dL (ref 45–182)
Saturation Ratios: 17 % — ABNORMAL LOW (ref 17.9–39.5)
TIBC: 331 ug/dL (ref 250–450)
UIBC: 276 ug/dL

## 2020-06-28 LAB — FERRITIN: Ferritin: 82 ng/mL (ref 24–336)

## 2020-06-28 LAB — VITAMIN B12: Vitamin B-12: 612 pg/mL (ref 180–914)

## 2020-06-29 LAB — IGG, IGA, IGM
IgA: 28 mg/dL — ABNORMAL LOW (ref 61–437)
IgG (Immunoglobin G), Serum: 242 mg/dL — ABNORMAL LOW (ref 603–1613)
IgM (Immunoglobulin M), Srm: 44 mg/dL (ref 15–143)

## 2020-07-05 ENCOUNTER — Ambulatory Visit (HOSPITAL_COMMUNITY): Payer: Medicare Other | Admitting: Hematology

## 2020-07-05 ENCOUNTER — Inpatient Hospital Stay (HOSPITAL_COMMUNITY): Payer: Medicare Other | Attending: Hematology | Admitting: Hematology

## 2020-07-05 ENCOUNTER — Other Ambulatory Visit: Payer: Self-pay

## 2020-07-05 VITALS — BP 162/65 | HR 57 | Temp 96.9°F | Resp 17 | Wt 154.0 lb

## 2020-07-05 DIAGNOSIS — D801 Nonfamilial hypogammaglobulinemia: Secondary | ICD-10-CM | POA: Insufficient documentation

## 2020-07-05 DIAGNOSIS — Z8572 Personal history of non-Hodgkin lymphomas: Secondary | ICD-10-CM | POA: Diagnosis not present

## 2020-07-05 DIAGNOSIS — C8330 Diffuse large B-cell lymphoma, unspecified site: Secondary | ICD-10-CM | POA: Diagnosis not present

## 2020-07-05 DIAGNOSIS — Z9221 Personal history of antineoplastic chemotherapy: Secondary | ICD-10-CM | POA: Insufficient documentation

## 2020-07-05 NOTE — Patient Instructions (Signed)
[facility] Discharge Instructions  You were seen today by Dr. Katragadda. He went over your recent results. Dr. Katragadda will see you back in 6 months for labs and follow up.   Thank you for choosing [facility] at [facility] to provide your oncology and hematology care.  To afford each patient quality time with our provider, please arrive at least 15 minutes before your scheduled appointment time.   If you have a lab appointment with the Cancer Center please come in thru the Main Entrance and check in at the main information desk  You need to re-schedule your appointment should you arrive 10 or more minutes late.  We strive to give you quality time with our providers, and arriving late affects you and other patients whose appointments are after yours.  Also, if you no show three or more times for appointments you may be dismissed from the clinic at the providers discretion.     Again, thank you for choosing [facility].  Our hope is that these requests will decrease the amount of time that you wait before being seen by our physicians.       _____________________________________________________________  Should you have questions after your visit to [facility], please contact our office at [phone] between the hours of 8:00 a.m. and 4:30 p.m.  Voicemails left after 4:00 p.m. will not be returned until the following business day.  For prescription refill requests, have your pharmacy contact our office and allow 72 hours.    Cancer Center Support Programs:   > Cancer Support Group  2nd Tuesday of the month 1pm-2pm, Journey Room    

## 2020-07-05 NOTE — Progress Notes (Signed)
[place] [place], [place] [address]   CLINIC:  Medical Oncology/Hematology  PCP:  Dennis Squibb, MD [address] [place] [address]  [phone]  REASON FOR VISIT:  Follow-up for DLBCL  PRIOR THERAPY: R-CHOP x 3 cycles from 09/01/2013 to 10/20/2013  CURRENT THERAPY: Surveillance  INTERVAL HISTORY:  [name], a 79 y.o. male, returns for routine follow-up for his DLBCL. Dennis Molina was last seen on 12/24/2019.  Today he reports feeling well. He continues working 2 days a week. He denies having any recent infections or F/C.  Because of his lymphoma status, he thinks Covid vaccine could be detrimental to his health and requests a letter to exempt from getting the vaccine.   REVIEW OF SYSTEMS:  Review of Systems  Constitutional: Positive for fatigue (80%). Negative for appetite change, chills and fever.  Genitourinary: Positive for difficulty urinating (d/t catheter).   Neurological: Positive for numbness (hands, toes & feet).  Psychiatric/Behavioral: The patient is nervous/anxious.   All other systems reviewed and are negative.   PAST MEDICAL/SURGICAL HISTORY:  Past Medical History:  Diagnosis Date  . BPH (benign prostatic hyperplasia)   . NHL (non-Hodgkin's lymphoma) [facility])    Past Surgical History:  Procedure Laterality Date  . HERNIA REPAIR  1980's    SOCIAL HISTORY:  Social History   Socioeconomic History  . Marital status: Married    Spouse name: Not on file  . Number of children: Not on file  . Years of education: Not on file  . Highest education level: Not on file  Occupational History  . Occupation: optemetrist  Tobacco Use  . Smoking status: Never Smoker  . Smokeless tobacco: Never Used  Vaping Use  . Vaping Use: Never used  Substance and Sexual Activity  . Alcohol use: Never  . Drug use: Never  . Sexual activity: Not on file  Other Topics Concern  . Not on file  Social History Narrative   Moved from [place].   Currently works as an Dietitian.  Married.    Social Determinants of Health   Financial Resource Strain:   . Difficulty of Paying Living Expenses: Not on file  Food Insecurity:   . Worried About Charity fundraiser in the Last Year: Not on file  . Ran Out of Food in the Last Year: Not on file  Transportation Needs:   . Lack of Transportation (Medical): Not on file  . Lack of Transportation (Non-Medical): Not on file  Physical Activity:   . Days of Exercise per Week: Not on file  . Minutes of Exercise per Session: Not on file  Stress:   . Feeling of Stress : Not on file  Social Connections:   . Frequency of Communication with Friends and Family: Not on file  . Frequency of Social Gatherings with Friends and Family: Not on file  . Attends Religious Services: Not on file  . Active Member of Clubs or Organizations: Not on file  . Attends Archivist Meetings: Not on file  . Marital Status: Not on file  Intimate Partner Violence:   . Fear of Current or Ex-Partner: Not on file  . Emotionally Abused: Not on file  . Physically Abused: Not on file  . Sexually Abused: Not on file    FAMILY HISTORY:  Family History  Problem Relation Age of Onset  . Diabetes Brother   . Heart disease Brother   . Alzheimer's disease  Mother   . Diabetes Father   . Stroke Father   . Diabetes Maternal Aunt     CURRENT MEDICATIONS:  Current Outpatient Medications  Medication Sig Dispense Refill  . acetaminophen (TYLENOL) 325 MG tablet Take 650 mg by mouth every 6 (six) hours as needed.    . Cholecalciferol 50 MCG (2000 UT) TABS Take 2,000 Units by mouth daily.     . Coenzyme Q10 100 MG capsule Take 100 mg by mouth daily.      No current facility-administered medications for this visit.    ALLERGIES:  Allergies  Allergen Reactions  . Ambien [Zolpidem Tartrate]     Delerious, hallucinations  . Benadryl [Diphenhydramine]     shakes  . Lorazepam Other (See Comments)     Confusion Confusion   . Zolpidem Other (See Comments)    hyper confusion Memory problems upset stomach Memory problems upset stomach     PHYSICAL EXAM:  Performance status (ECOG): 1 - Symptomatic but completely ambulatory  Vitals:   07/05/20 1129  BP: (!) 162/65  Pulse: (!) 57  Resp: 17  Temp: (!) 96.9 F (36.1 C)  SpO2: 100%   Wt Readings from Last 3 Encounters:  07/05/20 154 lb (69.9 kg)  12/24/19 154 lb 14.4 oz (70.3 kg)  06/25/19 152 lb 4.8 oz (69.1 kg)   Physical Exam Vitals reviewed.  Constitutional:      Appearance: Normal appearance.  Cardiovascular:     Rate and Rhythm: Normal rate and regular rhythm.     Pulses: Normal pulses.     Heart sounds: Normal heart sounds.  Pulmonary:     Effort: Pulmonary effort is normal.     Breath sounds: Normal breath sounds.  Abdominal:     Palpations: Abdomen is soft. There is no hepatomegaly, splenomegaly or mass.     Tenderness: There is no abdominal tenderness.     Hernia: A hernia (over L anterior abdominal renal incision scar) is present.  Musculoskeletal:     Right lower leg: No edema.     Left lower leg: No edema.  Lymphadenopathy:     Upper Body:     Right upper body: No axillary or pectoral adenopathy.     Left upper body: No axillary or pectoral adenopathy.     Lower Body: No right inguinal adenopathy. No left inguinal adenopathy.  Neurological:     General: No focal deficit present.     Mental Status: He is alert and oriented to person, place, and time.  Psychiatric:        Mood and Affect: Mood normal.        Behavior: Behavior normal.     LABORATORY DATA:  I have reviewed the labs as listed.  CBC Latest Ref Rng & Units 06/28/2020 12/17/2019 06/18/2019  WBC 4.0 - 10.5 K/uL 3.3(L) 2.8(L) 3.9(L)  Hemoglobin 13.0 - 17.0 g/dL 13.0 13.3 13.1  Hematocrit 39 - 52 % 39.9 40.7 40.0  Platelets 150 - 400 K/uL 123(L) 108(L) 116(L)   CMP Latest Ref Rng & Units 06/28/2020 12/17/2019 06/18/2019  Glucose 70 - 99  mg/dL 96 103(H) 99  BUN 8 - 23 mg/dL 27(H) 30(H) 31(H)  Creatinine 0.61 - 1.24 mg/dL 0.92 0.90 0.94  Sodium 135 - 145 mmol/L 138 140 138  Potassium 3.5 - 5.1 mmol/L 4.6 4.7 5.4(H)  Chloride 98 - 111 mmol/L 104 101 103  CO2 22 - 32 mmol/L 27 31 29   Calcium 8.9 - 10.3 mg/dL 9.0 9.3 9.0  Total  Protein 6.5 - 8.1 g/dL 6.0(L) 5.8(L) 5.7(L)  Total Bilirubin 0.3 - 1.2 mg/dL 1.1 1.2 0.9  Alkaline Phos 38 - 126 U/L 80 72 67  AST 15 - 41 U/L 22 26 25   ALT 0 - 44 U/L 18 23 23       Component Value Date/Time   RBC 4.23 06/28/2020 1047   MCV 94.3 06/28/2020 1047   MCH 30.7 06/28/2020 1047   MCHC 32.6 06/28/2020 1047   RDW 14.0 06/28/2020 1047   LYMPHSABS 0.6 (L) 06/28/2020 1047   MONOABS 0.3 06/28/2020 1047   EOSABS 0.1 06/28/2020 1047   BASOSABS 0.0 06/28/2020 1047   Lab Results  Component Value Date   LDH 166 06/28/2020   LDH 149 12/17/2019   LDH 158 06/18/2019   Lab Results  Component Value Date   TIBC 331 06/28/2020   FERRITIN 82 06/28/2020   IRONPCTSAT 17 (L) 06/28/2020   Lab Results  Component Value Date   IGGSERUM 242 (L) 06/28/2020   IGGSERUM 249 (L) 12/17/2019   IGGSERUM 259 (L) 06/18/2019   IGA 28 (L) 06/28/2020   IGA 24 (L) 12/17/2019   IGA 27 (L) 06/18/2019   IGMSERUM 44 06/28/2020   IGMSERUM 53 12/17/2019   IGMSERUM 44 06/18/2019     DIAGNOSTIC IMAGING:  I have independently reviewed the scans and discussed with the patient. No results found.   ASSESSMENT:  1.  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  Right perivena caval lobular mass matted to the IVC and right psoas measuring 69 x 35 x 29 cm. -3 cycles of R-CHOP at 50% dose from 09/01/2013 through 10/20/2013 with PET scan on 11/04/2013 showing interval reduction in the soft tissue mass with residual mass measuring 1.3 x 2.9 cm. -PET scan on 06/16/2014-NED.  PET scan on 12/20/2014 showed new small hypermetabolic right hilar and right paratracheal  lymph nodes with SUV 3.  He did not wish to pursue any further diagnostic procedures. 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  He also follows up with medical oncology at [facility] clinic in [place].  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 -He reports 2-3 UTIs a year.  He has to self catheterize himself about 4-6 times a day.  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  He declined work-up and pursued alternative treatment with traditional [place] medicine.  Hospitalized in June 2010 with abdominal pain, profound weakness, weight loss and confusion. -Treated with 4 cycles of bendamustine and rituximab. -He had prior exposure to agent orange in [place].   PLAN:  1.  Diffuse large B-cell lymphoma: -He does not have any B symptoms.  No palpable adenopathy or splenomegaly. -Reviewed labs from 06/28/2020.  LDH was normal.  CBC showed improved white count and stable platelet count. -No indication for scans at this time.  RTC 6 months with labs.  2.  Hypogammaglobulinemia: -IgG continues to be low at 242.  No infections in the last 6 months.  Hence we did not consider prophylactic immunoglobulins.   Orders placed this encounter:  No orders of the defined types were placed in this encounter.    Derek Jack, MD [place] [phone]   I, Milinda Antis, am acting as a scribe for Dr. Sanda Linger.  I, Derek Jack MD, have reviewed the above documentation for accuracy and completeness, and I agree with the above.

## 2020-09-22 ENCOUNTER — Telehealth: Payer: Self-pay

## 2020-09-22 DIAGNOSIS — I455 Other specified heart block: Secondary | ICD-10-CM | POA: Diagnosis not present

## 2020-09-22 DIAGNOSIS — I441 Atrioventricular block, second degree: Secondary | ICD-10-CM | POA: Diagnosis not present

## 2020-09-22 NOTE — Telephone Encounter (Signed)
Dr.McDowell spoke with Dr.Hall and patient is advised to go direct to [facility] ED

## 2020-09-22 NOTE — Telephone Encounter (Signed)
Received telephone call from Dr. Juel Burrow office in regards to patient that is in the office. States that patient has heart block with high blood pressure and low heart rate. Requesting to see if patient can be seen today. They are going to fax over an EKG for a provider to look at. Explained that our doctors do not have any openings today but to fax the EKG and we will have someone to look at EKG.  EKG was faxed to the [facility] for Dr. Domenic Polite to review.

## 2020-09-28 ENCOUNTER — Ambulatory Visit (INDEPENDENT_AMBULATORY_CARE_PROVIDER_SITE_OTHER): Payer: Medicare Other | Admitting: Internal Medicine

## 2020-09-28 ENCOUNTER — Other Ambulatory Visit: Payer: Self-pay

## 2020-09-28 ENCOUNTER — Other Ambulatory Visit (HOSPITAL_COMMUNITY)
Admission: RE | Admit: 2020-09-28 | Discharge: 2020-09-28 | Disposition: A | Discharge status: Home / Self Care | Payer: Medicare Other | Source: Ambulatory Visit | Attending: Internal Medicine | Admitting: Internal Medicine

## 2020-09-28 ENCOUNTER — Encounter: Payer: Self-pay | Admitting: *Deleted

## 2020-09-28 ENCOUNTER — Encounter: Payer: Self-pay | Admitting: Internal Medicine

## 2020-09-28 VITALS — BP 142/70 | HR 50 | Ht 70.0 in | Wt 146.0 lb

## 2020-09-28 DIAGNOSIS — Z01818 Encounter for other preprocedural examination: Secondary | ICD-10-CM | POA: Diagnosis not present

## 2020-09-28 DIAGNOSIS — Z01810 Encounter for preprocedural cardiovascular examination: Secondary | ICD-10-CM

## 2020-09-28 LAB — BASIC METABOLIC PANEL
Anion gap: 8 (ref 5–15)
BUN: 36 mg/dL — ABNORMAL HIGH (ref 8–23)
CO2: 27 mmol/L (ref 22–32)
Calcium: 9 mg/dL (ref 8.9–10.3)
Chloride: 102 mmol/L (ref 98–111)
Creatinine, Ser: 1.18 mg/dL (ref 0.61–1.24)
GFR, Estimated: >60 mL/min (ref >60)
Glucose, Bld: 86 mg/dL (ref 70–99)
Potassium: 5.4 mmol/L — ABNORMAL HIGH (ref 3.5–5.1)
Sodium: 137 mmol/L (ref 135–145)

## 2020-09-28 LAB — CBC
HCT: 43.2 % (ref 39.0–52.0)
Hemoglobin: 13.8 g/dL (ref 13.0–17.0)
MCH: 29.9 pg (ref 26.0–34.0)
MCHC: 31.9 g/dL (ref 30.0–36.0)
MCV: 93.7 fL (ref 80.0–100.0)
Platelets: 126 10*3/uL — ABNORMAL LOW (ref 150–400)
RBC: 4.61 MIL/uL (ref 4.22–5.81)
RDW: 14.3 % (ref 11.5–15.5)
WBC: 4.3 10*3/uL (ref 4.0–10.5)
nRBC: 0 % (ref 0.0–0.2)

## 2020-09-28 NOTE — Patient Instructions (Signed)
Medication Instructions:  Your physician recommends that you continue on your current medications as directed. Please refer to the Current Medication list given to you today. *If you need a refill on your cardiac medications before your next appointment, please call your pharmacy*   Lab Work: Your physician recommends that you return for lab work in: Today   If you have labs (blood work) drawn today and your tests are completely normal, you will receive your results only by: Marland Kitchen MyChart Message (if you have MyChart) OR . A paper copy in the mail If you have any lab test that is abnormal or we need to change your treatment, we will call you to review the results.   Testing/Procedures: Your physician has recommended that you have a pacemaker inserted. A pacemaker is a small device that is placed under the skin of your chest or abdomen to help control abnormal heart rhythms. This device uses electrical pulses to prompt the heart to beat at a normal rate. Pacemakers are used to treat heart rhythms that are too slow. Wire (leads) are attached to the pacemaker that goes into the chambers of you heart. This is done in the hospital and usually requires and overnight stay. Please see the instruction sheet given to you today for more information.     Follow-Up: At [facility], you and your health needs are our priority.  As part of our continuing mission to provide you with exceptional heart care, we have created designated Provider Care Teams.  These Care Teams include your primary Cardiologist (physician) and Advanced Practice Providers (APPs -  Physician Assistants and Nurse Practitioners) who all work together to provide you with the care you need, when you need it.  We recommend signing up for the patient portal called "MyChart".  Sign up information is provided on this After Visit Summary.  MyChart is used to connect with patients for Virtual Visits (Telemedicine).  Patients are able to view  lab/test results, encounter notes, upcoming appointments, etc.  Non-urgent messages can be sent to your provider as well.   To learn more about what you can do with MyChart, go to [email or web address].    Your next appointment:   3 month(s)  The format for your next appointment:   In Person  Provider:   Lewayne Bunting, MD   Other Instructions Thank you for choosing [facility]!

## 2020-09-28 NOTE — H&P (View-Only) (Signed)
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 Margo Aye for evaluation of bradycardia. 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 He has not had syncope but was out walking and felt fatigued and lightheaded and his wife (also an optometrist) felt his pulse and it was 30/min despite a bp of 180. Since then he has been sedentary. He has not had frank syncope. He denies chest pain or sob and no edema. Allergies  Allergen Reactions  . Ambien [Zolpidem Tartrate]     Delerious, hallucinations  . Benadryl [Diphenhydramine]     shakes  . Lorazepam Other (See Comments)    Confusion Confusion   . Zolpidem Other (See Comments)    hyper confusion Memory problems upset stomach Memory problems upset stomach   . Sulfamethoxazole-Trimethoprim Dermatitis, Itching and Rash     Current Outpatient Medications  Medication Sig Dispense Refill  . acetaminophen (TYLENOL) 325 MG tablet Take 650 mg by mouth every 6 (six) hours as needed.    . Cholecalciferol 50 MCG (2000 UT) TABS Take 2,000 Units by mouth daily.     . Coenzyme Q10 100 MG capsule Take 100 mg by mouth daily.      No current facility-administered medications for this visit.     Past Medical History:  Diagnosis Date  . BPH (benign prostatic hyperplasia)   . NHL (non-Hodgkin's lymphoma) (HCC)     ROS:   All systems reviewed and negative except as noted in the HPI.   Past Surgical History:  Procedure Laterality Date  . HERNIA REPAIR  1980's     Family History  Problem Relation Age of Onset  . Diabetes Brother   . Heart disease Brother   . Alzheimer's disease Mother   . Diabetes Father   . Stroke Father   . Diabetes Maternal Aunt      Social History   Socioeconomic History  . Marital status: Married    Spouse name: Not on file  . Number of children: Not on file  . Years of education: Not on file  . Highest education level: Not on file  Occupational History  .  Occupation: optemetrist  Tobacco Use  . Smoking status: Never Smoker  . Smokeless tobacco: Never Used  Vaping Use  . Vaping Use: Never used  Substance and Sexual Activity  . Alcohol use: Never  . Drug use: Never  . Sexual activity: Not on file  Other Topics Concern  . Not on file  Social History Narrative   Moved from [place].   Currently works as an Sport and exercise psychologist.  Married.    Social Determinants of Health   Financial Resource Strain: Not on file  Food Insecurity: Not on file  Transportation Needs: Not on file  Physical Activity: Not on file  Stress: Not on file  Social Connections: Not on file  Intimate Partner Violence: Not on file     BP (!) 142/70   Pulse (!) 50   Ht 5\' 10"  (1.778 m)   Wt 146 lb (66.2 kg)   SpO2 98%   BMI 20.95 kg/m   Physical Exam:  Well appearing NAD HEENT: Unremarkable Neck:  No JVD, no thyromegally Lymphatics:  No adenopathy Back:  No CVA tenderness Lungs:  Clear with no wheezes HEART:  Regular rate rhythm, no murmurs, no rubs, no clicks Abd:  soft, positive bowel sounds, no organomegally, no rebound, no guarding Ext:  2 plus pulses, no edema,  no cyanosis, no clubbing Skin:  No rashes no nodules Neuro:  CN II through XII intact, motor grossly intact  EKG - nsr withj 2:1 AV block  Assess/Plan: 1. Mobitz 2, second degree AV block - I have discussed the treatment options with the patient. He is not on any AV nodal blocking drugs. The risks/benefits/goals/expectations of DDD PM insertion were reviewed and he wishes to proceed.  Dewitte Vannice,MD 

## 2020-09-28 NOTE — Progress Notes (Signed)
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 Margo Aye for evaluation of bradycardia. 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 Dennis Molina has not had syncope but was out walking and felt fatigued and lightheaded and his wife (also an optometrist) felt his pulse and it was 30/min despite a bp of 180. Since then Dennis Molina has been sedentary. Dennis Molina has not had frank syncope. Dennis Molina denies chest pain or sob and no edema. Allergies  Allergen Reactions  . Ambien [Zolpidem Tartrate]     Delerious, hallucinations  . Benadryl [Diphenhydramine]     shakes  . Lorazepam Other (See Comments)    Confusion Confusion   . Zolpidem Other (See Comments)    hyper confusion Memory problems upset stomach Memory problems upset stomach   . Sulfamethoxazole-Trimethoprim Dermatitis, Itching and Rash     Current Outpatient Medications  Medication Sig Dispense Refill  . acetaminophen (TYLENOL) 325 MG tablet Take 650 mg by mouth every 6 (six) hours as needed.    . Cholecalciferol 50 MCG (2000 UT) TABS Take 2,000 Units by mouth daily.     . Coenzyme Q10 100 MG capsule Take 100 mg by mouth daily.      No current facility-administered medications for this visit.     Past Medical History:  Diagnosis Date  . BPH (benign prostatic hyperplasia)   . NHL (non-Hodgkin's lymphoma) (HCC)     ROS:   All systems reviewed and negative except as noted in the HPI.   Past Surgical History:  Procedure Laterality Date  . HERNIA REPAIR  1980's     Family History  Problem Relation Age of Onset  . Diabetes Brother   . Heart disease Brother   . Alzheimer's disease Mother   . Diabetes Father   . Stroke Father   . Diabetes Maternal Aunt      Social History   Socioeconomic History  . Marital status: Married    Spouse name: Not on file  . Number of children: Not on file  . Years of education: Not on file  . Highest education level: Not on file  Occupational History  .  Occupation: optemetrist  Tobacco Use  . Smoking status: Never Smoker  . Smokeless tobacco: Never Used  Vaping Use  . Vaping Use: Never used  Substance and Sexual Activity  . Alcohol use: Never  . Drug use: Never  . Sexual activity: Not on file  Other Topics Concern  . Not on file  Social History Narrative   Moved from [place].   Currently works as an Sport and exercise psychologist.  Married.    Social Determinants of Health   Financial Resource Strain: Not on file  Food Insecurity: Not on file  Transportation Needs: Not on file  Physical Activity: Not on file  Stress: Not on file  Social Connections: Not on file  Intimate Partner Violence: Not on file     BP (!) 142/70   Pulse (!) 50   Ht 5\' 10"  (1.778 m)   Wt 146 lb (66.2 kg)   SpO2 98%   BMI 20.95 kg/m   Physical Exam:  Well appearing NAD HEENT: Unremarkable Neck:  No JVD, no thyromegally Lymphatics:  No adenopathy Back:  No CVA tenderness Lungs:  Clear with no wheezes HEART:  Regular rate rhythm, no murmurs, no rubs, no clicks Abd:  soft, positive bowel sounds, no organomegally, no rebound, no guarding Ext:  2 plus pulses, no edema,  no cyanosis, no clubbing Skin:  No rashes no nodules Neuro:  CN II through XII intact, motor grossly intact  EKG - nsr withj 2:1 AV block  Assess/Plan: 1. Mobitz 2, second degree AV block - I have discussed the treatment options with the patient. Dennis Molina is not on any AV nodal blocking drugs. The risks/benefits/goals/expectations of DDD PM insertion were reviewed and Dennis Molina wishes to proceed.  Sharlot Gowda Andjela Wickes,MD

## 2020-09-29 NOTE — Progress Notes (Signed)
Instructed patient on the following items: Arrival time 1130 Nothing to eat or drink after midnight No meds AM of procedure Responsible person to drive you home and stay with you for 24 hrs Wash with special soap night before and morning of procedure  

## 2020-09-29 NOTE — Addendum Note (Signed)
Addended by: Kerney Elbe on: 09/29/2020 03:40 PM   Modules accepted: Orders

## 2020-09-30 ENCOUNTER — Other Ambulatory Visit (HOSPITAL_COMMUNITY)
Admission: RE | Admit: 2020-09-30 | Discharge: 2020-09-30 | Disposition: A | Discharge status: Home / Self Care | Payer: Medicare Other | Source: Ambulatory Visit | Attending: Internal Medicine | Admitting: Internal Medicine

## 2020-09-30 DIAGNOSIS — Z20822 Contact with and (suspected) exposure to covid-19: Secondary | ICD-10-CM | POA: Insufficient documentation

## 2020-09-30 DIAGNOSIS — Z01812 Encounter for preprocedural laboratory examination: Secondary | ICD-10-CM | POA: Diagnosis not present

## 2020-09-30 LAB — SARS CORONAVIRUS 2 (TAT 6-24 HRS): SARS Coronavirus 2: NEGATIVE

## 2020-10-03 ENCOUNTER — Ambulatory Visit (HOSPITAL_COMMUNITY)
Admission: RE | Admit: 2020-10-03 | Discharge: 2020-10-03 | Disposition: A | Discharge status: Home / Self Care | Payer: Medicare Other | Attending: Internal Medicine | Admitting: Internal Medicine

## 2020-10-03 ENCOUNTER — Encounter (HOSPITAL_COMMUNITY)
Admission: RE | Disposition: A | Discharge status: Home / Self Care | Payer: Self-pay | Source: Home / Self Care | Attending: Internal Medicine

## 2020-10-03 ENCOUNTER — Other Ambulatory Visit: Payer: Self-pay

## 2020-10-03 ENCOUNTER — Ambulatory Visit (HOSPITAL_COMMUNITY): Payer: Medicare Other

## 2020-10-03 ENCOUNTER — Encounter (HOSPITAL_COMMUNITY): Payer: Self-pay | Admitting: Internal Medicine

## 2020-10-03 DIAGNOSIS — I10 Essential (primary) hypertension: Secondary | ICD-10-CM | POA: Diagnosis not present

## 2020-10-03 DIAGNOSIS — I441 Atrioventricular block, second degree: Secondary | ICD-10-CM | POA: Diagnosis not present

## 2020-10-03 DIAGNOSIS — R001 Bradycardia, unspecified: Secondary | ICD-10-CM | POA: Diagnosis not present

## 2020-10-03 DIAGNOSIS — Z950 Presence of cardiac pacemaker: Secondary | ICD-10-CM

## 2020-10-03 DIAGNOSIS — Z79899 Other long term (current) drug therapy: Secondary | ICD-10-CM | POA: Diagnosis not present

## 2020-10-03 DIAGNOSIS — Z888 Allergy status to other drugs, medicaments and biological substances status: Secondary | ICD-10-CM | POA: Insufficient documentation

## 2020-10-03 DIAGNOSIS — Z881 Allergy status to other antibiotic agents status: Secondary | ICD-10-CM | POA: Insufficient documentation

## 2020-10-03 HISTORY — PX: PACEMAKER IMPLANT: EP1218

## 2020-10-03 SURGERY — PACEMAKER IMPLANT

## 2020-10-03 MED ORDER — FENTANYL CITRATE (PF) 100 MCG/2ML IJ SOLN
INTRAMUSCULAR | Status: DC | PRN
  Administered 2020-10-03 (×4): 12.5 ug via INTRAVENOUS

## 2020-10-03 MED ORDER — MIDAZOLAM HCL 5 MG/5ML IJ SOLN
INTRAMUSCULAR | Status: DC | PRN
  Administered 2020-10-03 (×4): 1 mg via INTRAVENOUS

## 2020-10-03 MED ORDER — LIDOCAINE HCL (PF) 1 % IJ SOLN
INTRAMUSCULAR | Status: AC
  Filled 2020-10-03: qty 60

## 2020-10-03 MED ORDER — METOPROLOL TARTRATE 5 MG/5ML IV SOLN
5.0000 mg | Freq: Once | INTRAVENOUS | Status: AC
  Administered 2020-10-03: 5 mg via INTRAVENOUS

## 2020-10-03 MED ORDER — HYDRALAZINE HCL 20 MG/ML IJ SOLN
5.0000 mg | Freq: Once | INTRAMUSCULAR | Status: AC
  Administered 2020-10-03: 5 mg via INTRAVENOUS
  Filled 2020-10-03: qty 1

## 2020-10-03 MED ORDER — HEPARIN (PORCINE) IN NACL 1000-0.9 UT/500ML-% IV SOLN
INTRAVENOUS | Status: DC | PRN
  Administered 2020-10-03: 500 mL

## 2020-10-03 MED ORDER — CEFAZOLIN SODIUM-DEXTROSE 2-4 GM/100ML-% IV SOLN
INTRAVENOUS | Status: AC
  Filled 2020-10-03: qty 100

## 2020-10-03 MED ORDER — POVIDONE-IODINE 10 % EX SWAB: 2.0000 "application " | Freq: Once | CUTANEOUS | Status: DC

## 2020-10-03 MED ORDER — ACETAMINOPHEN 325 MG PO TABS
325.0000 mg | ORAL_TABLET | ORAL | Status: DC | PRN
Start: 2020-10-03 — End: 2020-10-04
  Administered 2020-10-03: 650 mg via ORAL
  Filled 2020-10-03: qty 2

## 2020-10-03 MED ORDER — CEFAZOLIN SODIUM-DEXTROSE 2-4 GM/100ML-% IV SOLN
2.0000 g | INTRAVENOUS | Status: AC
  Administered 2020-10-03: 2 g via INTRAVENOUS

## 2020-10-03 MED ORDER — ONDANSETRON HCL 4 MG/2ML IJ SOLN: 4.0000 mg | Freq: Four times a day (QID) | INTRAMUSCULAR | Status: DC | PRN

## 2020-10-03 MED ORDER — FENTANYL CITRATE (PF) 100 MCG/2ML IJ SOLN
INTRAMUSCULAR | Status: AC
  Filled 2020-10-03: qty 2

## 2020-10-03 MED ORDER — SODIUM CHLORIDE 0.9 % IV SOLN: INTRAVENOUS | Status: DC

## 2020-10-03 MED ORDER — CEFAZOLIN SODIUM-DEXTROSE 1-4 GM/50ML-% IV SOLN
1.0000 g | Freq: Once | INTRAVENOUS | Status: AC
  Administered 2020-10-03: 1 g via INTRAVENOUS
  Filled 2020-10-03: qty 50

## 2020-10-03 MED ORDER — METOPROLOL TARTRATE 5 MG/5ML IV SOLN
INTRAVENOUS | Status: AC
  Filled 2020-10-03: qty 5

## 2020-10-03 MED ORDER — HEPARIN (PORCINE) IN NACL 1000-0.9 UT/500ML-% IV SOLN
INTRAVENOUS | Status: AC
  Filled 2020-10-03: qty 500

## 2020-10-03 MED ORDER — SODIUM CHLORIDE 0.9 % IV SOLN
80.0000 mg | INTRAVENOUS | Status: AC
  Administered 2020-10-03: 80 mg

## 2020-10-03 MED ORDER — MIDAZOLAM HCL 5 MG/5ML IJ SOLN
INTRAMUSCULAR | Status: AC
  Filled 2020-10-03: qty 5

## 2020-10-03 MED ORDER — LIDOCAINE HCL (PF) 1 % IJ SOLN
INTRAMUSCULAR | Status: DC | PRN
  Administered 2020-10-03: 50 mL

## 2020-10-03 MED ORDER — SODIUM CHLORIDE 0.9 % IV SOLN
INTRAVENOUS | Status: AC
  Filled 2020-10-03: qty 2

## 2020-10-03 SURGICAL SUPPLY — 12 items
CABLE SURGICAL S-101-97-12 (CABLE) ×2 IMPLANT
CATH RIGHTSITE C315HIS02 (CATHETERS) ×2 IMPLANT
IPG PACE AZUR XT DR MRI W1DR01 (Pacemaker) ×1 IMPLANT
LEAD CAPSURE NOVUS 5076-52CM (Lead) ×2 IMPLANT
LEAD SELECT SECURE 3830 383069 (Lead) ×1 IMPLANT
PACE AZURE XT DR MRI W1DR01 (Pacemaker) ×2 IMPLANT
PAD PRO RADIOLUCENT 2001M-C (PAD) ×2 IMPLANT
SELECT SECURE 3830 383069 (Lead) ×2 IMPLANT
SHEATH 7FR PRELUDE SNAP 13 (SHEATH) ×4 IMPLANT
SLITTER 6232ADJ (MISCELLANEOUS) ×2 IMPLANT
TRAY PACEMAKER INSERTION (PACKS) ×2 IMPLANT
WIRE HI TORQ VERSACORE-J 145CM (WIRE) ×2 IMPLANT

## 2020-10-03 NOTE — Interval H&P Note (Signed)
History and Physical Interval Note:  10/03/2020 11:47 AM  Dennis Molina  has presented today for surgery, with the diagnosis of av block.  The various methods of treatment have been discussed with the patient and family. After consideration of risks, benefits and other options for treatment, the patient has consented to  Procedure(s): PACEMAKER IMPLANT - Dual Chamber (N/A) as a surgical intervention.  The patient's history has been reviewed, patient examined, no change in status, stable for surgery.  I have reviewed the patient's chart and labs.  Questions were answered to the patient's satisfaction.     Lewayne Bunting

## 2020-10-03 NOTE — Discharge Instructions (Signed)
° ° ° °                                                                                                           ° ° °  Supplemental Discharge Instructions for  °Pacemaker/Defibrillator Patients ° °Tomorrow, 10/04/20, send in a device transmission ° °Activity °No heavy lifting or vigorous activity with your left/right arm for 6 to 8 weeks.  Do not raise your left/right arm above your head for one week.  Gradually raise your affected arm as drawn below. ° °        °   10/07/20                       10/08/20                      10/09/20                     10/10/20               °__ ° °NO DRIVING for 1 week    ; you may begin driving on  10/10/20  . ° °WOUND CARE °- Keep the wound area clean and dry.  Do not get this area wet , no showers for one week; you may shower on  10/10/20   . °- Tomorrow, 10/04/20, remove the arm sling °- Tomorrow, 10/04/20 remove the outer plastic bandage.  Underneath the plastic bandage there are steri strips (paper tapes), DO NOT remove these. °- The tape/steri-strips on your wound will fall off; do not pull them off.  No bandage is needed on the site.  DO  NOT apply any creams, oils, or ointments to the wound area. °- If you notice any drainage or discharge from the wound, any swelling or bruising at the site, or you develop a fever > 101? F after you are discharged home, call the office at once. ° °Special Instructions °- You are still able to use cellular telephones; use the ear opposite the side where you have your pacemaker/defibrillator.  Avoid carrying your cellular phone near your device. °- When traveling through airports, show security personnel your identification card to avoid being screened in the metal detectors.  Ask the security personnel to use the hand wand. °- Avoid arc welding equipment, MRI testing (magnetic resonance imaging), TENS units (transcutaneous nerve stimulators).  Call the office for questions about other devices. °- Avoid electrical appliances that are in poor condition  or are not properly grounded. °- Microwave ovens are safe to be near or to operate. ° ° °

## 2020-10-05 ENCOUNTER — Telehealth: Payer: Self-pay | Admitting: Internal Medicine

## 2020-10-05 NOTE — Telephone Encounter (Signed)
Pt's wife Randa Evens called stating that pt has been having headaches prior to having his pacemaker placed on Monday and since he's had that procedure he is having a very bad headache this morning that caused him to vomit. Would like to know if he can have a stronger medication other than Tylenol called in to help with this.   States they have not checked BP this morning, pulse is good, temp is good, and wound from procedure looks great. BP yesterday was 200/78  Please give wife a call [phone]

## 2020-10-05 NOTE — Telephone Encounter (Signed)
Patient's wife called(DPR) Dennis Molina called and reports patient has been having HA since December and the headache became so bad last night the patient vomited. Reports patient had HA on Monday that subsided before ppm implant 10/03/20. The headache returned 10/04/20 in the evening and was not relieved by Tylenol 1 tablet . Advised patient may take Tylenol 500 mg 2 tablets every 6 hours for pain.Pacemaker site with no edema, drainage , bleeding , and steri-strips in place.  Wife reports that patient's BP was 200/ 80 and pulse 70 bpm. No stroke s/sx. Advised patient and wife to contact PCP concerning HA and ED precautions given.

## 2020-10-05 NOTE — Telephone Encounter (Signed)
LMOM with DC # and hours. No pain meds given but Tylenol . PCP should be consulted for HA. New ppm . Get remote.

## 2020-10-06 DIAGNOSIS — J019 Acute sinusitis, unspecified: Secondary | ICD-10-CM | POA: Diagnosis not present

## 2020-10-06 DIAGNOSIS — I10 Essential (primary) hypertension: Secondary | ICD-10-CM | POA: Diagnosis not present

## 2020-10-06 DIAGNOSIS — R0981 Nasal congestion: Secondary | ICD-10-CM | POA: Diagnosis not present

## 2020-10-13 ENCOUNTER — Ambulatory Visit (INDEPENDENT_AMBULATORY_CARE_PROVIDER_SITE_OTHER): Payer: Medicare Other | Admitting: Emergency Medicine

## 2020-10-13 ENCOUNTER — Other Ambulatory Visit: Payer: Self-pay

## 2020-10-13 DIAGNOSIS — I441 Atrioventricular block, second degree: Secondary | ICD-10-CM

## 2020-10-13 LAB — CUP PACEART INCLINIC DEVICE CHECK
Battery Remaining Longevity: 130 mo
Battery Voltage: 3.21 V
Brady Statistic AP VP Percent: 57.74 %
Brady Statistic AP VS Percent: 0 %
Brady Statistic AS VP Percent: 42.12 %
Brady Statistic AS VS Percent: 0.14 %
Brady Statistic RA Percent Paced: 57.73 %
Brady Statistic RV Percent Paced: 99.85 %
Date Time Interrogation Session: 20220113151637
Implantable Lead Implant Date: 20220103
Implantable Lead Implant Date: 20220103
Implantable Lead Location: 753859
Implantable Lead Location: 753860
Implantable Lead Model: 3830
Implantable Lead Model: 5076
Implantable Pulse Generator Implant Date: 20220103
Lead Channel Impedance Value: 304 Ohm
Lead Channel Impedance Value: 399 Ohm
Lead Channel Impedance Value: 437 Ohm
Lead Channel Impedance Value: 589 Ohm
Lead Channel Pacing Threshold Amplitude: 0.5 V
Lead Channel Pacing Threshold Amplitude: 0.75 V
Lead Channel Pacing Threshold Pulse Width: 0.4 ms
Lead Channel Pacing Threshold Pulse Width: 0.4 ms
Lead Channel Sensing Intrinsic Amplitude: 2.875 mV
Lead Channel Sensing Intrinsic Amplitude: 23.5 mV
Lead Channel Setting Pacing Amplitude: 3.5 V
Lead Channel Setting Pacing Amplitude: 3.5 V
Lead Channel Setting Pacing Pulse Width: 0.4 ms
Lead Channel Setting Sensing Sensitivity: 1.2 mV

## 2020-10-13 NOTE — Progress Notes (Signed)
Wound check appointment. Steri-strips removed. Wound without redness or edema. Incision edges approximated, wound well healed. Normal device function. Sensing, impedances and RA threshold consistent with implant measurements. Device programmed at 3.5V for extra safety margin until 3 month visit. Histogram distribution appropriate for patient and level of activity. No mode switches or high ventricular rates noted. After visit completed, noted that patient identifying info is not programmed into device, this will need to be completed at 90 day follow-up.  Patient educated about wound care, arm mobility, lifting restrictions. Patient provided with MDT relay remote monitor, education provided to send activation transmission when they arrive home.  ROV with Dr. Lovena Le 12/27/20.

## 2020-10-18 ENCOUNTER — Ambulatory Visit: Payer: Medicare Other | Admitting: Internal Medicine

## 2020-10-20 ENCOUNTER — Encounter: Payer: Self-pay | Admitting: Nurse Practitioner

## 2020-10-20 ENCOUNTER — Other Ambulatory Visit: Payer: Self-pay

## 2020-10-20 ENCOUNTER — Other Ambulatory Visit: Payer: Self-pay | Admitting: Nurse Practitioner

## 2020-10-20 ENCOUNTER — Ambulatory Visit (INDEPENDENT_AMBULATORY_CARE_PROVIDER_SITE_OTHER): Payer: Medicare Other | Admitting: Nurse Practitioner

## 2020-10-20 DIAGNOSIS — I10 Essential (primary) hypertension: Secondary | ICD-10-CM | POA: Diagnosis not present

## 2020-10-20 DIAGNOSIS — J329 Chronic sinusitis, unspecified: Secondary | ICD-10-CM | POA: Insufficient documentation

## 2020-10-20 DIAGNOSIS — J019 Acute sinusitis, unspecified: Secondary | ICD-10-CM | POA: Diagnosis not present

## 2020-10-20 MED ORDER — OLMESARTAN MEDOXOMIL 20 MG PO TABS: 20.0000 mg | ORAL_TABLET | Freq: Every day | ORAL | 1 refills | Status: DC

## 2020-10-20 MED ORDER — AMOXICILLIN-POT CLAVULANATE 875-125 MG PO TABS: 1.0000 | ORAL_TABLET | Freq: Two times a day (BID) | ORAL | 0 refills | Status: DC

## 2020-10-20 NOTE — Assessment & Plan Note (Signed)
-  BP 175/77 today -he had recent pacemaker placement, was avoiding BP meds because he was afraid his HR would decrease -he is symptomatic with headaches -HR 67 today, home readings were in low 60s -Rx. olmesartan

## 2020-10-20 NOTE — Patient Instructions (Signed)
You were seen today for sinus infection and headaches.  For the sinus infection, I sent in Augmentin. Please take this as prescribed.  For headaches and high blood pressure, I sent in olmesartan. This should not decrease your heart rate. It works by decreasing vasoconstriction and should not cause any drastic changes in your heart rate.  I started you on a half of a tablet by mouth (a total of 10 mg), but I anticipate that you will need more than this given your current BP.  Therefore, I wrote the prescription to increase to 1 whole tablet in about a week (or after you see Dr. Posey Pronto).  I made Dr. Posey Pronto aware of your recent pacemaker and that he will be seeing you, and he is looking forward to meeting you as well!

## 2020-10-20 NOTE — Assessment & Plan Note (Signed)
-  has been ongoing for 4 weeks -he tried a z-pack, but that did not help -Rx. augmentin

## 2020-10-20 NOTE — Progress Notes (Signed)
Acute Office Visit  Subjective:    Patient ID: Dennis Molina, male    DOB: [birth year], 80 y.o.   MRN: [id number]  Chief Complaint  Patient presents with  . Nasal Congestion    X 4 weeks  . Nausea    X 4 weeks   . Headache    X 4 weeks     HPI Patient is in today for sick visit. He has had nasal congestion, nausea, and headache for 4 weeks. He used a z-pack about a week and a half ago for sinus infeciton, but his symptoms have not resolved.  He had a pacemaker placed several weeks ago.  He states his headache is worse in the evening.  Past Medical History:  Diagnosis Date  . BPH (benign prostatic hyperplasia)   . NHL (non-Hodgkin's lymphoma) [facility])     Past Surgical History:  Procedure Laterality Date  . HERNIA REPAIR  1980's  . PACEMAKER IMPLANT N/A 10/03/2020   Procedure: PACEMAKER IMPLANT - Dual Chamber;  Surgeon: Evans Lance, MD;  Location: [facility];  Service: Cardiovascular;  Laterality: N/A;    Family History  Problem Relation Age of Onset  . Diabetes Brother   . Heart disease Brother   . Alzheimer's disease Mother   . Diabetes Father   . Stroke Father   . Diabetes Maternal Aunt     Social History   Socioeconomic History  . Marital status: Married    Spouse name: Not on file  . Number of children: Not on file  . Years of education: Not on file  . Highest education level: Not on file  Occupational History  . Occupation: optemetrist  Tobacco Use  . Smoking status: Never Smoker  . Smokeless tobacco: Never Used  Vaping Use  . Vaping Use: Never used  Substance and Sexual Activity  . Alcohol use: Never  . Drug use: Never  . Sexual activity: Not on file  Other Topics Concern  . Not on file  Social History Narrative   Moved from [place].   Currently works as an Dietitian.  Married.    Social Determinants of Health   Financial Resource Strain: Not on file  Food Insecurity: Not on file  Transportation Needs: Not on file  Physical  Activity: Not on file  Stress: Not on file  Social Connections: Not on file  Intimate Partner Violence: Not on file    Outpatient Medications Prior to Visit  Medication Sig Dispense Refill  . acetaminophen (TYLENOL) 325 MG tablet Take 650 mg by mouth every 6 (six) hours as needed.    . Cholecalciferol 50 MCG (2000 UT) TABS Take 2,000 Units by mouth daily.     . Coenzyme Q10 100 MG capsule Take 100 mg by mouth daily.     . Garlic 001 MG TABS Take 100 mg by mouth daily.    . Probiotic Product (PROBIOTIC DAILY PO) Take 1 capsule by mouth daily.     No facility-administered medications prior to visit.    Allergies  Allergen Reactions  . Ambien [Zolpidem Tartrate] Other (See Comments)    Delerious, hallucinations  . Zolpidem Other (See Comments)    hyper confusion Memory problems upset stomach Memory problems upset stomach   . Benadryl [Diphenhydramine] Other (See Comments)    shakes  . Lorazepam Other (See Comments)    Confusion Confusion   . Sulfamethoxazole-Trimethoprim Dermatitis, Itching and Rash    Review of Systems  Constitutional: Positive for fever. Negative  for chills and fatigue.  HENT: Positive for congestion, postnasal drip, rhinorrhea, sinus pressure and sinus pain. Negative for sore throat.   Respiratory: Negative for cough, chest tightness, shortness of breath and wheezing.        Objective:    Physical Exam  BP (!) 175/77   Pulse 68   Temp 97.6 F (36.4 C)   Resp 20   Ht 5\' 10"  (1.778 m)   Wt 142 lb (64.4 kg)   SpO2 97%   BMI 20.37 kg/m  Wt Readings from Last 3 Encounters:  10/20/20 142 lb (64.4 kg)  10/03/20 140 lb (63.5 kg)  09/28/20 146 lb (66.2 kg)    Health Maintenance Due  Topic Date Due  . Hepatitis C Screening  Never done  . COVID-19 Vaccine (1) Never done  . TETANUS/TDAP  Never done  . PNA vac Low Risk Adult (2 of 2 - PCV13) 03/16/2013  . INFLUENZA VACCINE  Never done    There are no preventive care reminders to display for  this patient.   No results found for: TSH Lab Results  Component Value Date   WBC 4.3 09/28/2020   HGB 13.8 09/28/2020   HCT 43.2 09/28/2020   MCV 93.7 09/28/2020   PLT 126 (L) 09/28/2020   Lab Results  Component Value Date   NA 137 09/28/2020   K 5.4 (H) 09/28/2020   CO2 27 09/28/2020   GLUCOSE 86 09/28/2020   BUN 36 (H) 09/28/2020   CREATININE 1.18 09/28/2020   BILITOT 1.1 06/28/2020   ALKPHOS 80 06/28/2020   AST 22 06/28/2020   ALT 18 06/28/2020   PROT 6.0 (L) 06/28/2020   ALBUMIN 4.0 06/28/2020   CALCIUM 9.0 09/28/2020   ANIONGAP 8 09/28/2020   No results found for: CHOL No results found for: HDL No results found for: LDLCALC No results found for: TRIG No results found for: CHOLHDL No results found for: HGBA1C     Assessment & Plan:   Problem List Items Addressed This Visit      Cardiovascular and Mediastinum   Primary hypertension    -BP 175/77 today -he had recent pacemaker placement, was avoiding BP meds because he was afraid his HR would decrease -he is symptomatic with headaches -HR 67 today, home readings were in low 60s -Rx. olmesartan      Relevant Medications   olmesartan (BENICAR) 20 MG tablet     Respiratory   Sinusitis    -has been ongoing for 4 weeks -he tried a z-pack, but that did not help -Rx. augmentin      Relevant Medications   amoxicillin-clavulanate (AUGMENTIN) 875-125 MG tablet       Meds ordered this encounter  Medications  . olmesartan (BENICAR) 20 MG tablet    Sig: Take 1 tablet (20 mg total) by mouth daily. Take a half of a tablet PO daily for the first week. Then increase to one tablet PO daily.    Dispense:  30 tablet    Refill:  1  . amoxicillin-clavulanate (AUGMENTIN) 875-125 MG tablet    Sig: Take 1 tablet by mouth 2 (two) times daily.    Dispense:  20 tablet    Refill:  0     Noreene Larsson, NP

## 2020-10-24 ENCOUNTER — Ambulatory Visit: Payer: Medicare Other | Admitting: Internal Medicine

## 2020-10-27 ENCOUNTER — Other Ambulatory Visit: Payer: Self-pay

## 2020-10-27 ENCOUNTER — Encounter: Payer: Self-pay | Admitting: Internal Medicine

## 2020-10-27 ENCOUNTER — Ambulatory Visit (INDEPENDENT_AMBULATORY_CARE_PROVIDER_SITE_OTHER): Payer: Medicare Other | Admitting: Internal Medicine

## 2020-10-27 ENCOUNTER — Ambulatory Visit: Payer: Medicare Other | Admitting: Internal Medicine

## 2020-10-27 ENCOUNTER — Other Ambulatory Visit: Payer: Self-pay | Admitting: Nurse Practitioner

## 2020-10-27 VITALS — BP 160/77 | HR 69 | Temp 97.7°F | Resp 16 | Ht 70.0 in | Wt 142.1 lb

## 2020-10-27 DIAGNOSIS — I10 Essential (primary) hypertension: Secondary | ICD-10-CM

## 2020-10-27 DIAGNOSIS — Z9079 Acquired absence of other genital organ(s): Secondary | ICD-10-CM | POA: Diagnosis not present

## 2020-10-27 DIAGNOSIS — Z7689 Persons encountering health services in other specified circumstances: Secondary | ICD-10-CM | POA: Diagnosis not present

## 2020-10-27 DIAGNOSIS — E041 Nontoxic single thyroid nodule: Secondary | ICD-10-CM

## 2020-10-27 DIAGNOSIS — R338 Other retention of urine: Secondary | ICD-10-CM

## 2020-10-27 DIAGNOSIS — D801 Nonfamilial hypogammaglobulinemia: Secondary | ICD-10-CM

## 2020-10-27 DIAGNOSIS — N401 Enlarged prostate with lower urinary tract symptoms: Secondary | ICD-10-CM

## 2020-10-27 DIAGNOSIS — C8338 Diffuse large B-cell lymphoma, lymph nodes of multiple sites: Secondary | ICD-10-CM | POA: Diagnosis not present

## 2020-10-27 DIAGNOSIS — I441 Atrioventricular block, second degree: Secondary | ICD-10-CM

## 2020-10-27 NOTE — Patient Instructions (Signed)
Please start taking Olmesartan 20 mg once daily.  Please follow low salt diet and perform moderate exercise/walking as tolerated.  You are being referred to Urology for local follow up.  Okay to use Claritin for seasonal allergies. Please avoid fragranced products to avoid allergies.

## 2020-10-27 NOTE — Assessment & Plan Note (Signed)
S/p chemotherapy Follows up with Oncology

## 2020-10-27 NOTE — Assessment & Plan Note (Signed)
Has had TURP and vaporization procedure in the past due to urinary incontinence Does self catheterization about 5-6 times in a day Followed up with [facility] Requests a local referral, Urology referral provided.

## 2020-10-27 NOTE — Assessment & Plan Note (Signed)
S/p pacemaker placement Follows up with Cardiology

## 2020-10-27 NOTE — Assessment & Plan Note (Signed)
BP Readings from Last 1 Encounters:  10/27/20 (!) 160/77   Was recently started on Olmesartan 10 mg QD -> increase to 20 mg QD Counseled for compliance with the medications Advised DASH diet and moderate exercise/walking, at least 150 mins/week

## 2020-10-27 NOTE — Progress Notes (Signed)
New Patient Office Visit  Subjective:  Patient ID: Dennis Molina, male    DOB: [birth year]  Age: 80 y.o. MRN: [id number]  CC:  Chief Complaint  Patient presents with  . 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 He is a [place] patient.  He was recently seen for headache and sinusitis, for which he was prescribed Augmentin. He was also started on Olmesartan for HTN. His headache and sinus symptoms have improved now. He has had intermittent sinus symptoms for almost 6 weeks.  He has been following up with Oncology for diffuse large B cell lymphoma, for which he has completed chemotherapy.  He has h/o BPH and prostate surgeries for urinary retention. He self catheterizes about 5-6 times and has had multiple UTIs in the past. He follows up with [facility], but requests a local Urology referral.  His BP was elevated today. He has brought his BP readings from home, which have been ranging between 120s-150s/70-80s. He denies any chest pain, dyspnea or palpitations.  He has had COVID vaccine. Denies taking any other vaccine.  Past Medical History:  Diagnosis Date  . BPH (benign prostatic hyperplasia)   . NHL (non-Hodgkin's lymphoma) [facility])     Past Surgical History:  Procedure Laterality Date  . HERNIA REPAIR  1980's  . PACEMAKER IMPLANT N/A 10/03/2020   Procedure: PACEMAKER IMPLANT - Dual Chamber;  Surgeon: Evans Lance, MD;  Location: [facility] [facility];  Service: Cardiovascular;  Laterality: N/A;    Family History  Problem Relation Age of Onset  . Diabetes Brother   . Heart disease Brother   . Alzheimer's disease  Mother   . Diabetes Father   . Stroke Father   . Diabetes Maternal Aunt     Social History   Socioeconomic History  . Marital status: Married    Spouse name: Not on file  . Number of children: Not on file  . Years of education: Not on file  . Highest education level: Not on file  Occupational History  . Occupation: optemetrist  Tobacco Use  . Smoking status: Never Smoker  . Smokeless tobacco: Never Used  Vaping Use  . Vaping Use: Never used  Substance and Sexual Activity  . Alcohol use: Never  . Drug use: Never  . Sexual activity: Not on file  Other Topics Concern  . Not on file  Social History Narrative   Moved from [place].   Currently works as an Dietitian.  Married.    Social Determinants of Health   Financial Resource Strain: Not on file  Food Insecurity: Not on file  Transportation Needs: Not on file  Physical Activity: Not on file  Stress: Not on file  Social Connections: Not on file  Intimate Partner Violence: Not on file    ROS Review of Systems  Constitutional: Negative for chills and fever.  HENT: Positive for sinus pressure and sinus pain. Negative for congestion and sore throat.   Eyes: Negative for pain and discharge.  Respiratory: Negative for cough and shortness of breath.   Cardiovascular: Negative for chest pain and palpitations.  Gastrointestinal: Negative  for constipation, diarrhea, nausea and vomiting.  Endocrine: Negative for polydipsia and polyuria.  Genitourinary: Positive for difficulty urinating. Negative for dysuria and hematuria.  Musculoskeletal: Negative for neck pain and neck stiffness.  Skin: Negative for rash.  Neurological: Positive for headaches. Negative for dizziness, weakness and numbness.  Psychiatric/Behavioral: Negative for agitation and behavioral problems.    Objective:   Today's Vitals: BP (!) 160/77 (BP Location: Right Arm, Patient Position: Sitting, Cuff Size: Normal)   Pulse 69   Temp 97.7 F (36.5 C)  (Oral)   Resp 16   Ht '5\' 10"'  (1.778 m)   Wt 142 lb 1.9 oz (64.5 kg)   SpO2 100%   BMI 20.39 kg/m   Physical Exam Vitals reviewed.  Constitutional:      General: He is not in acute distress.    Appearance: He is not diaphoretic.  HENT:     Head: Normocephalic and atraumatic.     Nose: Nose normal.     Mouth/Throat:     Mouth: Mucous membranes are moist.  Eyes:     General: No scleral icterus.    Extraocular Movements: Extraocular movements intact.     Pupils: Pupils are equal, round, and reactive to light.  Cardiovascular:     Rate and Rhythm: Normal rate and regular rhythm.     Pulses: Normal pulses.     Heart sounds: Normal heart sounds. No murmur heard.   Pulmonary:     Breath sounds: Normal breath sounds. No wheezing or rales.  Abdominal:     Palpations: Abdomen is soft.     Tenderness: There is no abdominal tenderness.  Musculoskeletal:     Cervical back: Neck supple. No tenderness.     Right lower leg: No edema.     Left lower leg: No edema.  Skin:    General: Skin is warm.     Findings: No rash.  Neurological:     General: No focal deficit present.     Mental Status: He is alert and oriented to person, place, and time.     Sensory: No sensory deficit.     Motor: No weakness.  Psychiatric:        Mood and Affect: Mood normal.        Behavior: Behavior normal.     Assessment & Plan:   Problem List Items Addressed This Visit      Encounter to establish care    -  Primary Care established Previous chart reviewed History and medications reviewed with the patient   Relevant Orders  CBC with Differential  CMP14+EGFR  Hemoglobin A1c  Lipid panel  Vitamin D (25 hydroxy)    Cardiovascular and Mediastinum   Mobitz type 2 second degree atrioventricular block    S/p pacemaker placement Follows up with Cardiology      Relevant Orders   TSH   Primary hypertension    BP Readings from Last 1 Encounters:  10/27/20 (!) 160/77   Was recently started  on Olmesartan 10 mg QD -> increase to 20 mg QD Counseled for compliance with the medications Advised DASH diet and moderate exercise/walking, at least 150 mins/week       Relevant Orders   CBC with Differential   CMP14+EGFR   Lipid panel   TSH     Endocrine   Nontoxic single thyroid nodule    Stable in size according to [facility]        Immune and Lymphatic   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      Relevant Orders   Ambulatory referral to Urology   PSA     Other   Hypogammaglobulinemia ([place])    Follows up with Oncology Stable currently with no LAD or B symptoms       Other Visit Diagnoses       S/P TURP       Relevant Orders   Ambulatory referral to Urology      Outpatient Encounter Medications as of 10/27/2020  Medication Sig  . acetaminophen (TYLENOL) 325 MG tablet Take 650 mg by mouth every 6 (six) hours as needed.  Marland Kitchen amoxicillin-clavulanate (AUGMENTIN) 875-125 MG tablet Take 1 tablet by mouth 2 (two) times daily.  . Cholecalciferol 50 MCG (2000 UT) TABS Take 2,000 Units by mouth daily.   . Coenzyme Q10 100 MG capsule Take 100 mg by mouth daily.   Marland Kitchen olmesartan (BENICAR) 20 MG tablet TAKE 1 TABLET BY MOUTH EVERY DAY. TAKE 1/2 OF A TABLET BY MOUTH FOR FIRST WEEK THEN INCREASE TO 1 TABLET  . [DISCONTINUED] Garlic 992 MG TABS Take 100 mg by mouth daily.  . [DISCONTINUED] Probiotic Product (PROBIOTIC DAILY PO) Take 1 capsule by mouth daily.   No facility-administered encounter medications on file as of 10/27/2020.    Follow-up: Return in about 4 months  (around 02/24/2021).   Lindell Spar, MD

## 2020-10-27 NOTE — Assessment & Plan Note (Signed)
Follows up with Oncology Stable currently with no LAD or B symptoms

## 2020-10-27 NOTE — Assessment & Plan Note (Signed)
Stable in size according to previous [facility] Check TSH

## 2020-10-28 ENCOUNTER — Ambulatory Visit: Payer: Medicare Other | Admitting: Internal Medicine

## 2020-11-01 ENCOUNTER — Telehealth: Payer: Self-pay

## 2020-11-01 NOTE — Telephone Encounter (Signed)
Noted Copied Sleeved  

## 2020-11-02 NOTE — Telephone Encounter (Signed)
Complete

## 2020-11-09 DIAGNOSIS — I10 Essential (primary) hypertension: Secondary | ICD-10-CM

## 2020-12-05 ENCOUNTER — Ambulatory Visit: Payer: Medicare Other | Admitting: Urology

## 2020-12-27 ENCOUNTER — Ambulatory Visit (INDEPENDENT_AMBULATORY_CARE_PROVIDER_SITE_OTHER): Payer: Medicare Other | Admitting: Internal Medicine

## 2020-12-27 ENCOUNTER — Other Ambulatory Visit: Payer: Self-pay

## 2020-12-27 ENCOUNTER — Encounter: Payer: Self-pay | Admitting: Internal Medicine

## 2020-12-27 VITALS — BP 130/70 | HR 64 | Ht 70.0 in | Wt 145.0 lb

## 2020-12-27 DIAGNOSIS — I442 Atrioventricular block, complete: Secondary | ICD-10-CM

## 2020-12-27 LAB — CUP PACEART INCLINIC DEVICE CHECK
Battery Remaining Longevity: 139 mo
Battery Voltage: 3.18 V
Brady Statistic AP VP Percent: 33.57 %
Brady Statistic AP VS Percent: 0 %
Brady Statistic AS VP Percent: 66.24 %
Brady Statistic AS VS Percent: 0.18 %
Brady Statistic RA Percent Paced: 33.67 %
Brady Statistic RV Percent Paced: 99.81 %
Date Time Interrogation Session: 20220329122729
Implantable Lead Implant Date: 20220103
Implantable Lead Implant Date: 20220103
Implantable Lead Location: 753859
Implantable Lead Location: 753860
Implantable Lead Model: 3830
Implantable Lead Model: 5076
Implantable Pulse Generator Implant Date: 20220103
Lead Channel Impedance Value: 323 Ohm
Lead Channel Impedance Value: 437 Ohm
Lead Channel Impedance Value: 475 Ohm
Lead Channel Impedance Value: 589 Ohm
Lead Channel Pacing Threshold Amplitude: 0.5 V
Lead Channel Pacing Threshold Amplitude: 0.75 V
Lead Channel Pacing Threshold Pulse Width: 0.4 ms
Lead Channel Pacing Threshold Pulse Width: 0.4 ms
Lead Channel Sensing Intrinsic Amplitude: 3 mV
Lead Channel Setting Pacing Amplitude: 2 V
Lead Channel Setting Pacing Amplitude: 2.5 V
Lead Channel Setting Pacing Pulse Width: 0.4 ms
Lead Channel Setting Sensing Sensitivity: 1.2 mV

## 2020-12-27 NOTE — Progress Notes (Signed)
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 He was found to have high grade heart block and underwent PPM insertion. He feels better since his PPM was placed. His energy level is improved. His bp has been better.  Allergies  Allergen Reactions  . Ambien [Zolpidem Tartrate] Other (See Comments)    Delerious, hallucinations  . Zolpidem Other (See Comments)    hyper confusion Memory problems upset stomach Memory problems upset stomach   . Benadryl [Diphenhydramine] Other (See Comments)    shakes  . Lorazepam Other (See Comments)    Confusion Confusion   . Sulfamethoxazole-Trimethoprim Dermatitis, Itching and Rash     Current Outpatient Medications  Medication Sig Dispense Refill  . acetaminophen (TYLENOL) 325 MG tablet Take 650 mg by mouth every 6 (six) hours as needed.    . Cholecalciferol 50 MCG (2000 UT) TABS Take 2,000 Units by mouth daily.     . Coenzyme Q10 100 MG capsule Take 100 mg by mouth daily.     Marland Kitchen olmesartan (BENICAR) 20 MG tablet TAKE 1 TABLET BY MOUTH EVERY DAY. TAKE 1/2 OF A TABLET BY MOUTH FOR FIRST WEEK THEN INCREASE TO 1 TABLET 90 tablet 1   No current facility-administered medications for this visit.     Past Medical History:  Diagnosis Date  . BPH (benign prostatic hyperplasia)   . NHL (non-Hodgkin's lymphoma) ([place])     ROS:   All systems reviewed and negative except as noted in the HPI.   Past Surgical History:  Procedure Laterality Date  . HERNIA REPAIR  1980's  . PACEMAKER IMPLANT N/A 10/03/2020   Procedure: PACEMAKER IMPLANT - Dual Chamber;  Surgeon: Evans Lance, MD;  Location: [place] [facility];  Service: Cardiovascular;  Laterality: N/A;     Family History  Problem Relation Age of Onset  . Diabetes Brother   . Heart disease Brother   . Alzheimer's disease Mother   . Diabetes Father   . Stroke Father   .  Diabetes Maternal Aunt      Social History   Socioeconomic History  . Marital status: Married    Spouse name: Not on file  . Number of children: Not on file  . Years of education: Not on file  . Highest education level: Not on file  Occupational History  . Occupation: optemetrist  Tobacco Use  . Smoking status: Never Smoker  . Smokeless tobacco: Never Used  Vaping Use  . Vaping Use: Never used  Substance and Sexual Activity  . Alcohol use: Never  . Drug use: Never  . Sexual activity: Not on file  Other Topics Concern  . Not on file  Social History Narrative   Moved from [place].   Currently works as an Dietitian.  Married.    Social Determinants of Health   Financial Resource Strain: Not on file  Food Insecurity: Not on file  Transportation Needs: Not on file  Physical Activity: Not on file  Stress: Not on file  Social Connections: Not on file  Intimate Partner Violence: Not on file     BP 130/70   Pulse 64   Ht 5\' 10"  (1.778 m)   Wt 145 lb (65.8 kg)   SpO2 99%   BMI 20.81 kg/m   Physical Exam:  Well appearing NAD HEENT: Unremarkable Neck:  No JVD,  no thyromegally Lymphatics:  No adenopathy Back:  No CVA tenderness Lungs:  Clear with no wheezes HEART:  Regular rate rhythm, no murmurs, no rubs, no clicks Abd:  soft, positive bowel sounds, no organomegally, no rebound, no guarding Ext:  2 plus pulses, no edema, no cyanosis, no clubbing Skin:  No rashes no nodules Neuro:  CN II through XII intact, motor grossly intact  DEVICE  Normal device function.  See PaceArt for details.   Assess/Plan: 1. CHB - he has no escape today and has underlying CHB. He is asymptomatic, s/p PPM insertion. 2. PPM -his medtronic DDD PPM is working normally. We will recheck in several months. 3. HTN - his bp is improved. No change in his meds today though he might require uptitration in the future. 4. Dyspnea - he has some with with exertion. He will undergo a 2D  echo.  Carleene Overlie Jamareon Shimel,MD

## 2020-12-27 NOTE — Patient Instructions (Signed)
Medication Instructions:  Your physician recommends that you continue on your current medications as directed. Please refer to the Current Medication list given to you today.  *If you need a refill on your cardiac medications before your next appointment, please call your pharmacy*   Lab Work: NONE   If you have labs (blood work) drawn today and your tests are completely normal, you will receive your results only by: Marland Kitchen MyChart Message (if you have MyChart) OR . A paper copy in the mail If you have any lab test that is abnormal or we need to change your treatment, we will call you to review the results.   Testing/Procedures: Your physician has requested that you have an echocardiogram. Echocardiography is a painless test that uses sound waves to create images of your heart. It provides your doctor with information about the size and shape of your heart and how well your heart's chambers and valves are working. This procedure takes approximately one hour. There are no restrictions for this procedure.   Follow-Up: At [facility], you and your health needs are our priority.  As part of our continuing mission to provide you with exceptional heart care, we have created designated Provider Care Teams.  These Care Teams include your primary Cardiologist (physician) and Advanced Practice Providers (APPs -  Physician Assistants and Nurse Practitioners) who all work together to provide you with the care you need, when you need it.  We recommend signing up for the patient portal called "MyChart".  Sign up information is provided on this After Visit Summary.  MyChart is used to connect with patients for Virtual Visits (Telemedicine).  Patients are able to view lab/test results, encounter notes, upcoming appointments, etc.  Non-urgent messages can be sent to your provider as well.   To learn more about what you can do with MyChart, go to [email or web address].    Your next appointment:   1  year(s)  The format for your next appointment:   In Person  Provider:   Cristopher Peru, MD   Other Instructions Thank you for choosing [facility]!

## 2021-01-03 ENCOUNTER — Other Ambulatory Visit (HOSPITAL_COMMUNITY)
Admission: RE | Admit: 2021-01-03 | Discharge: 2021-01-03 | Disposition: A | Discharge status: Home / Self Care | Payer: Medicare Other | Source: Ambulatory Visit | Attending: Internal Medicine | Admitting: Internal Medicine

## 2021-01-03 ENCOUNTER — Other Ambulatory Visit: Payer: Self-pay

## 2021-01-03 ENCOUNTER — Ambulatory Visit (INDEPENDENT_AMBULATORY_CARE_PROVIDER_SITE_OTHER): Payer: Medicare Other

## 2021-01-03 ENCOUNTER — Inpatient Hospital Stay (HOSPITAL_COMMUNITY): Payer: Medicare Other | Attending: Hematology

## 2021-01-03 DIAGNOSIS — Z7689 Persons encountering health services in other specified circumstances: Secondary | ICD-10-CM | POA: Insufficient documentation

## 2021-01-03 DIAGNOSIS — N401 Enlarged prostate with lower urinary tract symptoms: Secondary | ICD-10-CM | POA: Insufficient documentation

## 2021-01-03 DIAGNOSIS — D801 Nonfamilial hypogammaglobulinemia: Secondary | ICD-10-CM | POA: Insufficient documentation

## 2021-01-03 DIAGNOSIS — I442 Atrioventricular block, complete: Secondary | ICD-10-CM | POA: Insufficient documentation

## 2021-01-03 DIAGNOSIS — I252 Old myocardial infarction: Secondary | ICD-10-CM | POA: Insufficient documentation

## 2021-01-03 DIAGNOSIS — I441 Atrioventricular block, second degree: Secondary | ICD-10-CM

## 2021-01-03 DIAGNOSIS — Z950 Presence of cardiac pacemaker: Secondary | ICD-10-CM | POA: Insufficient documentation

## 2021-01-03 DIAGNOSIS — R338 Other retention of urine: Secondary | ICD-10-CM | POA: Insufficient documentation

## 2021-01-03 DIAGNOSIS — I10 Essential (primary) hypertension: Secondary | ICD-10-CM | POA: Insufficient documentation

## 2021-01-03 DIAGNOSIS — C8330 Diffuse large B-cell lymphoma, unspecified site: Secondary | ICD-10-CM

## 2021-01-03 DIAGNOSIS — Z8572 Personal history of non-Hodgkin lymphomas: Secondary | ICD-10-CM | POA: Insufficient documentation

## 2021-01-03 LAB — CUP PACEART REMOTE DEVICE CHECK
Battery Remaining Longevity: 136 mo
Battery Voltage: 3.18 V
Brady Statistic AP VP Percent: 31.39 %
Brady Statistic AP VS Percent: 0 %
Brady Statistic AS VP Percent: 68.6 %
Brady Statistic AS VS Percent: 0.01 %
Brady Statistic RA Percent Paced: 31.38 %
Brady Statistic RV Percent Paced: 99.99 %
Date Time Interrogation Session: 20220404190922
Implantable Lead Implant Date: 20220103
Implantable Lead Implant Date: 20220103
Implantable Lead Location: 753859
Implantable Lead Location: 753860
Implantable Lead Model: 3830
Implantable Lead Model: 5076
Implantable Pulse Generator Implant Date: 20220103
Lead Channel Impedance Value: 304 Ohm
Lead Channel Impedance Value: 399 Ohm
Lead Channel Impedance Value: 437 Ohm
Lead Channel Impedance Value: 551 Ohm
Lead Channel Pacing Threshold Amplitude: 0.5 V
Lead Channel Pacing Threshold Amplitude: 0.625 V
Lead Channel Pacing Threshold Pulse Width: 0.4 ms
Lead Channel Pacing Threshold Pulse Width: 0.4 ms
Lead Channel Sensing Intrinsic Amplitude: 2.875 mV
Lead Channel Sensing Intrinsic Amplitude: 2.875 mV
Lead Channel Sensing Intrinsic Amplitude: 21 mV
Lead Channel Sensing Intrinsic Amplitude: 21 mV
Lead Channel Setting Pacing Amplitude: 2 V
Lead Channel Setting Pacing Amplitude: 2.5 V
Lead Channel Setting Pacing Pulse Width: 0.4 ms
Lead Channel Setting Sensing Sensitivity: 1.2 mV

## 2021-01-03 LAB — HEMOGLOBIN A1C
Hgb A1c MFr Bld: 4.3 % — ABNORMAL LOW (ref 4.8–5.6)
Mean Plasma Glucose: 76.71 mg/dL

## 2021-01-03 LAB — LIPID PANEL
Cholesterol: 148 mg/dL (ref 0–200)
HDL: 63 mg/dL (ref >40)
LDL Cholesterol: 77 mg/dL (ref 0–99)
Total CHOL/HDL Ratio: 2.3 RATIO
Triglycerides: 41 mg/dL (ref <150)
VLDL: 8 mg/dL (ref 0–40)

## 2021-01-03 LAB — COMPREHENSIVE METABOLIC PANEL
ALT: 26 U/L (ref 0–44)
AST: 30 U/L (ref 15–41)
Albumin: 4.5 g/dL (ref 3.5–5.0)
Alkaline Phosphatase: 85 U/L (ref 38–126)
Anion gap: 9 (ref 5–15)
BUN: 40 mg/dL — ABNORMAL HIGH (ref 8–23)
CO2: 28 mmol/L (ref 22–32)
Calcium: 9.3 mg/dL (ref 8.9–10.3)
Chloride: 103 mmol/L (ref 98–111)
Creatinine, Ser: 1.09 mg/dL (ref 0.61–1.24)
GFR, Estimated: >60 mL/min (ref >60)
Glucose, Bld: 83 mg/dL (ref 70–99)
Potassium: 4.7 mmol/L (ref 3.5–5.1)
Sodium: 140 mmol/L (ref 135–145)
Total Bilirubin: 0.9 mg/dL (ref 0.3–1.2)
Total Protein: 6.3 g/dL — ABNORMAL LOW (ref 6.5–8.1)

## 2021-01-03 LAB — CBC WITH DIFFERENTIAL/PLATELET
Abs Immature Granulocytes: 0 10*3/uL (ref 0.00–0.07)
Basophils Absolute: 0 10*3/uL (ref 0.0–0.1)
Basophils Relative: 1 %
Eosinophils Absolute: 0.1 10*3/uL (ref 0.0–0.5)
Eosinophils Relative: 3 %
HCT: 41.9 % (ref 39.0–52.0)
Hemoglobin: 13.9 g/dL (ref 13.0–17.0)
Immature Granulocytes: 0 %
Lymphocytes Relative: 24 %
Lymphs Abs: 0.6 10*3/uL — ABNORMAL LOW (ref 0.7–4.0)
MCH: 32 pg (ref 26.0–34.0)
MCHC: 33.2 g/dL (ref 30.0–36.0)
MCV: 96.5 fL (ref 80.0–100.0)
Monocytes Absolute: 0.2 10*3/uL (ref 0.1–1.0)
Monocytes Relative: 7 %
Neutro Abs: 1.8 10*3/uL (ref 1.7–7.7)
Neutrophils Relative %: 65 %
Platelets: 110 10*3/uL — ABNORMAL LOW (ref 150–400)
RBC: 4.34 MIL/uL (ref 4.22–5.81)
RDW: 14 % (ref 11.5–15.5)
WBC: 2.7 10*3/uL — ABNORMAL LOW (ref 4.0–10.5)
nRBC: 0 % (ref 0.0–0.2)

## 2021-01-03 LAB — IRON AND TIBC
Iron: 57 ug/dL (ref 45–182)
Saturation Ratios: 15 % — ABNORMAL LOW (ref 17.9–39.5)
TIBC: 377 ug/dL (ref 250–450)
UIBC: 320 ug/dL

## 2021-01-03 LAB — FERRITIN: Ferritin: 71 ng/mL (ref 24–336)

## 2021-01-03 LAB — TSH: TSH: 2.219 u[IU]/mL (ref 0.350–4.500)

## 2021-01-03 LAB — LACTATE DEHYDROGENASE: LDH: 163 U/L (ref 98–192)

## 2021-01-03 LAB — VITAMIN D 25 HYDROXY (VIT D DEFICIENCY, FRACTURES): Vit D, 25-Hydroxy: 58.95 ng/mL (ref 30–100)

## 2021-01-03 LAB — PSA: Prostatic Specific Antigen: 9.94 ng/mL — ABNORMAL HIGH (ref 0.00–4.00)

## 2021-01-03 LAB — FOLATE: Folate: 33.5 ng/mL (ref >5.9)

## 2021-01-03 LAB — VITAMIN B12: Vitamin B-12: 542 pg/mL (ref 180–914)

## 2021-01-04 LAB — IGG, IGA, IGM
IgA: 27 mg/dL — ABNORMAL LOW (ref 61–437)
IgG (Immunoglobin G), Serum: 236 mg/dL — ABNORMAL LOW (ref 603–1613)
IgM (Immunoglobulin M), Srm: 63 mg/dL (ref 15–143)

## 2021-01-10 ENCOUNTER — Inpatient Hospital Stay (HOSPITAL_BASED_OUTPATIENT_CLINIC_OR_DEPARTMENT_OTHER): Payer: Medicare Other | Admitting: Hematology

## 2021-01-10 ENCOUNTER — Other Ambulatory Visit: Payer: Self-pay

## 2021-01-10 VITALS — BP 119/55 | HR 66 | Temp 97.2°F | Resp 18 | Wt 143.8 lb

## 2021-01-10 DIAGNOSIS — Z950 Presence of cardiac pacemaker: Secondary | ICD-10-CM | POA: Diagnosis not present

## 2021-01-10 DIAGNOSIS — Z8572 Personal history of non-Hodgkin lymphomas: Secondary | ICD-10-CM | POA: Diagnosis not present

## 2021-01-10 DIAGNOSIS — I252 Old myocardial infarction: Secondary | ICD-10-CM | POA: Diagnosis not present

## 2021-01-10 DIAGNOSIS — C8330 Diffuse large B-cell lymphoma, unspecified site: Secondary | ICD-10-CM | POA: Diagnosis not present

## 2021-01-10 DIAGNOSIS — D801 Nonfamilial hypogammaglobulinemia: Secondary | ICD-10-CM | POA: Diagnosis not present

## 2021-01-10 DIAGNOSIS — I442 Atrioventricular block, complete: Secondary | ICD-10-CM | POA: Diagnosis not present

## 2021-01-10 NOTE — Progress Notes (Signed)
[place], [place] [address]   CLINIC:  Medical Oncology/Hematology  PCP:  Lindell Spar, MD [address] / [place] [address]  [phone]  REASON FOR VISIT:  Follow-up for DLBCL  PRIOR THERAPY: R-CHOP x 3 cycles from 09/01/2013 to 10/20/2013  CURRENT THERAPY: Surveillance  INTERVAL HISTORY:  [name], a 80 y.o. male, returns for routine follow-up for his DLBCL. Dennis Molina was last seen on 07/05/2020.  Today he reports feeling okay. He reports that his pacemaker was placed on 10/03/2020, due to his A/V block s/p MI, which is now completely maintaining his heart rate. He was also started on Benicar. He denies having any recent infections, F/C or night sweats.  He is planning on moving to [place], [place], in the next 1-2 months.   REVIEW OF SYSTEMS:  Review of Systems  Constitutional: Positive for fatigue (75%). Negative for appetite change, chills, diaphoresis and fever.  All other systems reviewed and are negative.   PAST MEDICAL/SURGICAL HISTORY:  Past Medical History:  Diagnosis Date  . BPH (benign prostatic hyperplasia)   . NHL (non-Hodgkin's lymphoma) [facility])    Past Surgical History:  Procedure Laterality Date  . HERNIA REPAIR  1980's  . PACEMAKER IMPLANT N/A 10/03/2020   Procedure: PACEMAKER IMPLANT - Dual Chamber;  Surgeon: Evans Lance, MD;  Location: [place] [facility];  Service: Cardiovascular;  Laterality: N/A;    SOCIAL HISTORY:  Social History   Socioeconomic History  . Marital status: Married    Spouse name: Not on file  . Number of children: Not on file  . Years of education: Not on file  . Highest education level: Not on file  Occupational History  . Occupation: optemetrist  Tobacco Use  . Smoking status: Never Smoker  . Smokeless tobacco: Never Used  Vaping Use  . Vaping Use: Never used  Substance and Sexual Activity  . Alcohol use: Never  . Drug use: Never  . Sexual activity: Not on  file  Other Topics Concern  . Not on file  Social History Narrative   Moved from [place].   Currently works as an Dietitian.  Married.    Social Determinants of Health   Financial Resource Strain: Not on file  Food Insecurity: Not on file  Transportation Needs: Not on file  Physical Activity: Not on file  Stress: Not on file  Social Connections: Not on file  Intimate Partner Violence: Not on file    FAMILY HISTORY:  Family History  Problem Relation Age of Onset  . Diabetes Brother   . Heart disease Brother   . Alzheimer's disease Mother   . Diabetes Father   . Stroke Father   . Diabetes Maternal Aunt     CURRENT MEDICATIONS:  Current Outpatient Medications  Medication Sig Dispense Refill  . Cholecalciferol 50 MCG (2000 UT) TABS Take 2,000 Units by mouth daily.     . Coenzyme Q10 100 MG capsule Take 100 mg by mouth daily.     Marland Kitchen olmesartan (BENICAR) 20 MG tablet TAKE 1 TABLET BY MOUTH EVERY DAY. TAKE 1/2 OF A TABLET BY MOUTH FOR FIRST WEEK THEN INCREASE TO 1 TABLET 90 tablet 1  . acetaminophen (TYLENOL) 325 MG tablet Take 650 mg by mouth every 6 (six) hours as needed. (Patient not taking: Reported on 01/10/2021)     No current facility-administered medications for this visit.    ALLERGIES:  Allergies  Allergen Reactions  .  Ambien [Zolpidem Tartrate] Other (See Comments)    Delerious, hallucinations  . Zolpidem Other (See Comments)    hyper confusion Memory problems upset stomach Memory problems upset stomach   . Benadryl [Diphenhydramine] Other (See Comments)    shakes  . Lorazepam Other (See Comments)    Confusion Confusion   . Sulfamethoxazole-Trimethoprim Dermatitis, Itching and Rash    PHYSICAL EXAM:  Performance status (ECOG): 1 - Symptomatic but completely ambulatory  Vitals:   01/10/21 1142  BP: (!) 119/55  Pulse: 66  Resp: 18  Temp: (!) 97.2 F (36.2 C)  SpO2: 100%   Wt Readings from Last 3 Encounters:  01/10/21 143 lb 12.8 oz (65.2 kg)   12/27/20 145 lb (65.8 kg)  10/27/20 142 lb 1.9 oz (64.5 kg)   Physical Exam Vitals reviewed.  Constitutional:      Appearance: Normal appearance.  Cardiovascular:     Rate and Rhythm: Normal rate and regular rhythm.     Pulses: Normal pulses.     Heart sounds: Normal heart sounds.  Pulmonary:     Effort: Pulmonary effort is normal.     Breath sounds: Normal breath sounds.  Chest:  Breasts:     Right: No axillary adenopathy or supraclavicular adenopathy.     Left: No axillary adenopathy or supraclavicular adenopathy.      Comments: Pacemaker in L chest Abdominal:     Palpations: Abdomen is soft. There is no hepatomegaly, splenomegaly or mass.     Tenderness: There is no abdominal tenderness.     Hernia: No hernia is present.  Musculoskeletal:     Right lower leg: No edema.     Left lower leg: No edema.  Lymphadenopathy:     Cervical: No cervical adenopathy.     Upper Body:     Right upper body: No supraclavicular, axillary or pectoral adenopathy.     Left upper body: No supraclavicular, axillary or pectoral adenopathy.     Lower Body: No right inguinal adenopathy. No left inguinal adenopathy.  Neurological:     General: No focal deficit present.     Mental Status: He is alert and oriented to person, place, and time.  Psychiatric:        Mood and Affect: Mood normal.        Behavior: Behavior normal.     LABORATORY DATA:  I have reviewed the labs as listed.  CBC Latest Ref Rng & Units 01/03/2021 09/28/2020 06/28/2020  WBC 4.0 - 10.5 K/uL 2.7(L) 4.3 3.3(L)  Hemoglobin 13.0 - 17.0 g/dL 13.9 13.8 13.0  Hematocrit 39.0 - 52.0 % 41.9 43.2 39.9  Platelets 150 - 400 K/uL 110(L) 126(L) 123(L)   CMP Latest Ref Rng & Units 01/03/2021 09/28/2020 06/28/2020  Glucose 70 - 99 mg/dL 83 86 96  BUN 8 - 23 mg/dL 40(H) 36(H) 27(H)  Creatinine 0.61 - 1.24 mg/dL 1.09 1.18 0.92  Sodium 135 - 145 mmol/L 140 137 138  Potassium 3.5 - 5.1 mmol/L 4.7 5.4(H) 4.6  Chloride 98 - 111 mmol/L 103  102 104  CO2 22 - 32 mmol/L 28 27 27   Calcium 8.9 - 10.3 mg/dL 9.3 9.0 9.0  Total Protein 6.5 - 8.1 g/dL 6.3(L) - 6.0(L)  Total Bilirubin 0.3 - 1.2 mg/dL 0.9 - 1.1  Alkaline Phos 38 - 126 U/L 85 - 80  AST 15 - 41 U/L 30 - 22  ALT 0 - 44 U/L 26 - 18      Component Value Date/Time   RBC  4.34 01/03/2021 0950   MCV 96.5 01/03/2021 0950   MCH 32.0 01/03/2021 0950   MCHC 33.2 01/03/2021 0950   RDW 14.0 01/03/2021 0950   LYMPHSABS 0.6 (L) 01/03/2021 0950   MONOABS 0.2 01/03/2021 0950   EOSABS 0.1 01/03/2021 0950   BASOSABS 0.0 01/03/2021 0950   Lab Results  Component Value Date   LDH 163 01/03/2021   LDH 166 06/28/2020   LDH 149 12/17/2019   Lab Results  Component Value Date   TIBC 377 01/03/2021   TIBC 331 06/28/2020   FERRITIN 71 01/03/2021   FERRITIN 82 06/28/2020   IRONPCTSAT 15 (L) 01/03/2021   IRONPCTSAT 17 (L) 06/28/2020   Lab Results  Component Value Date   VD25OH 58.95 01/03/2021    DIAGNOSTIC IMAGING:  I have independently reviewed the scans and discussed with the patient. CUP PACEART INCLINIC DEVICE CHECK  Result Date: 12/27/2020 Pacemaker check in clinic. Normal device function. Thresholds, sensing, impedances consistent with previous measurements. Device programmed to maximize longevity. No mode switch 1 NSVT, < 20 beats. RA threshold programmed to monitor with output at 2.0V. RV output programmed to monitor at 2.5 V. Device programmed at appropriate safety margins. Histogram distribution appropriate for patient activity level. Estimated longevity 14.6 years. Patient enrolled in remote follow-up 01/03/21. Patient education completed. Entered patients information into device. No information was available during this check. Confirmed data with Ronalee Belts from [employer].Lavenia Atlas, BSN, RN Pacemaker check in clinic. Normal device function. Thresholds, sensing, impedances consistent with previous measurements. Device programmed to maximize longevity. No mode switch 1 NSVT, <  20 beats. RA threshold programmed to monitor with output at 2.0V. RV output programmed to monitor at 2.5 V. Device programmed at appropriate safety margins. Histogram distribution appropriate for patient activity level. Estimated longevity 14.6 years. Patient enrolled in remote follow-up 01/03/21. Patient education completed. Entered patients information into device. No information was available during this check. Confirmed data with Ronalee Belts from [employer].Lavenia Atlas, BSN, RN  CUP PACEART REMOTE DEVICE CHECK  Result Date: 01/03/2021 Scheduled remote reviewed. Normal device function.  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 Right perivena caval lobular mass matted to the IVC and right psoas measuring 69 x 35 x 29 cm. -3 cycles of R-CHOP at 50% dose from 09/01/2013 through 10/20/2013 with PET scan on 11/04/2013 showing interval reduction in the soft tissue mass with residual mass measuring 1.3 x 2.9 cm. -PET scan on 06/16/2014-NED. PET scan on 12/20/2014 showed new small hypermetabolic right hilar and right paratracheal lymph nodes with SUV 3. He did not wish to pursue any further diagnostic procedures. 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 He also follows up with medical oncology at [facility] clinic in [place].  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 -He reports 2-3 UTIs a year. He has to self catheterize himself about 4-6 times a day.  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 He declined work-up and pursued alternative treatment with traditional [place] medicine. Hospitalized in June 2010 with abdominal pain, profound weakness, weight loss and confusion. -Treated with 4 cycles of bendamustine and rituximab. -He had prior exposure to agent orange in  [place].   PLAN:  1. Diffuse large B-cell lymphoma: -He does not have any fevers, night sweats or weight loss. -No palpable adenopathy or splenomegaly on physical examination today. -Reviewed labs  from 01/03/2021 with normal LDH. 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  Hemoglobin was normal.  Ferritin, Z61 and folic acid were normal.  He has chronic cytopenias from prior treatments. -He reportedly had pacemaker placement for complete heart block since last visit and was started on Benicar. 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  He reports moving to [facility] in the next 2 to 3 months. -We will make a referral to medical oncology in [place].  2. Hypogammaglobulinemia: -IgG continues to be low at 236 due to prior rituximab therapy. -He does not have recurrent infections.  Hence I did not recommend prophylactic immunoglobulin.  Orders placed this encounter:  No orders of the defined types were placed in this encounter.    Derek Jack, MD [place] [phone]   I, Milinda Antis, am acting as a scribe for Dr. Sanda Linger.  I, Derek Jack MD, have reviewed the above documentation for accuracy and completeness, and I agree with the above.

## 2021-01-10 NOTE — Patient Instructions (Addendum)
[place] at [facility] Discharge Instructions  You were seen today by Dr. Delton Coombes. He went over your recent results. Your records will be transferred to a hemotologist/oncologist in [place].   Thank you for choosing [place] at [facility] to provide your oncology and hematology care.  To afford each patient quality time with our provider, please arrive at least 15 minutes before your scheduled appointment time.   If you have a lab appointment with the [place] please come in thru the Main Entrance and check in at the main information desk  You need to re-schedule your appointment should you arrive 10 or more minutes late.  We strive to give you quality time with our providers, and arriving late affects you and other patients whose appointments are after yours.  Also, if you no show three or more times for appointments you may be dismissed from the clinic at the providers discretion.     Again, thank you for choosing [facility].  Our hope is that these requests will decrease the amount of time that you wait before being seen by our physicians.       _____________________________________________________________  Should you have questions after your visit to [facility], please contact our office at [phone] between the hours of 8:00 a.m. and 4:30 p.m.  Voicemails left after 4:00 p.m. will not be returned until the following business day.  For prescription refill requests, have your pharmacy contact our office and allow 72 hours.    Cancer Center Support Programs:   > Cancer Support Group  2nd Tuesday of the month 1pm-2pm, Journey Room

## 2021-01-13 NOTE — Progress Notes (Signed)
Remote pacemaker transmission.   

## 2021-01-18 ENCOUNTER — Ambulatory Visit (HOSPITAL_COMMUNITY)
Admission: RE | Admit: 2021-01-18 | Discharge: 2021-01-18 | Disposition: A | Discharge status: Home / Self Care | Payer: Medicare Other | Source: Ambulatory Visit | Attending: Internal Medicine | Admitting: Internal Medicine

## 2021-01-18 ENCOUNTER — Telehealth: Payer: Self-pay | Admitting: Internal Medicine

## 2021-01-18 ENCOUNTER — Other Ambulatory Visit: Payer: Self-pay

## 2021-01-18 DIAGNOSIS — I442 Atrioventricular block, complete: Secondary | ICD-10-CM | POA: Insufficient documentation

## 2021-01-18 LAB — ECHOCARDIOGRAM COMPLETE
AR max vel: 2.54 cm2
AV Area VTI: 2.73 cm2
AV Area mean vel: 2.52 cm2
AV Mean grad: 3.4 mmHg
AV Peak grad: 7 mmHg
Ao pk vel: 1.32 m/s
Area-P 1/2: 3.01 cm2
S' Lateral: 2.4 cm

## 2021-01-18 NOTE — Progress Notes (Signed)
*  PRELIMINARY RESULTS* Echocardiogram 2D Echocardiogram has been performed.  Dennis Molina 01/18/2021, 11:17 AM

## 2021-01-18 NOTE — Telephone Encounter (Signed)
Left message for patient to call back and schedule Medicare Annual Wellness Visit (AWV) either virtually or in office.   AWV-I PER [place] 10/02/2019 please schedule at anytime with [facility]  health coach  This should be a 40 minute visit.

## 2021-02-24 ENCOUNTER — Encounter: Payer: Self-pay | Admitting: Internal Medicine

## 2021-02-24 ENCOUNTER — Other Ambulatory Visit: Payer: Self-pay

## 2021-02-24 ENCOUNTER — Ambulatory Visit (INDEPENDENT_AMBULATORY_CARE_PROVIDER_SITE_OTHER): Payer: Medicare Other | Admitting: Internal Medicine

## 2021-02-24 VITALS — BP 118/68 | HR 70 | Temp 98.2°F | Resp 18 | Ht 69.0 in | Wt 144.0 lb

## 2021-02-24 DIAGNOSIS — C8338 Diffuse large B-cell lymphoma, lymph nodes of multiple sites: Secondary | ICD-10-CM | POA: Diagnosis not present

## 2021-02-24 DIAGNOSIS — H25813 Combined forms of age-related cataract, bilateral: Secondary | ICD-10-CM | POA: Insufficient documentation

## 2021-02-24 DIAGNOSIS — I10 Essential (primary) hypertension: Secondary | ICD-10-CM

## 2021-02-24 DIAGNOSIS — H903 Sensorineural hearing loss, bilateral: Secondary | ICD-10-CM | POA: Insufficient documentation

## 2021-02-24 DIAGNOSIS — N398 Other specified disorders of urinary system: Secondary | ICD-10-CM | POA: Insufficient documentation

## 2021-02-24 DIAGNOSIS — D696 Thrombocytopenia, unspecified: Secondary | ICD-10-CM | POA: Insufficient documentation

## 2021-02-24 DIAGNOSIS — I441 Atrioventricular block, second degree: Secondary | ICD-10-CM

## 2021-02-24 DIAGNOSIS — G609 Hereditary and idiopathic neuropathy, unspecified: Secondary | ICD-10-CM | POA: Insufficient documentation

## 2021-02-24 DIAGNOSIS — R319 Hematuria, unspecified: Secondary | ICD-10-CM | POA: Insufficient documentation

## 2021-02-24 DIAGNOSIS — H9319 Tinnitus, unspecified ear: Secondary | ICD-10-CM | POA: Insufficient documentation

## 2021-02-24 DIAGNOSIS — K036 Deposits [accretions] on teeth: Secondary | ICD-10-CM | POA: Insufficient documentation

## 2021-02-24 NOTE — Progress Notes (Signed)
Established Patient Office Visit  Subjective:  Patient ID: Dennis Molina, male    DOB: [birth year]  Age: 80 y.o. MRN: [id number]  CC:  Chief Complaint  Patient presents with  . 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  HTN: His BP was enl today. He states that his BP has been around 100s/60s sometimes at home and asks if Olmesartan dose should be decreased. He denies any headache, dizziness, chest pain, dyspnea or palpitations.  He has been following up with Oncology for diffuse large B cell lymphoma, for which he has completed chemotherapy.  He has h/o BPH and prostate surgeries for urinary retention. He self catheterizes about 5-6 times and has had multiple UTIs in the past. He follows up with [facility], but requests a local Urology referral.   Past Medical History:  Diagnosis Date  . BPH (benign prostatic hyperplasia)   . NHL (non-Hodgkin's lymphoma) [facility])     Past Surgical History:  Procedure Laterality Date  . HERNIA REPAIR  1980's  . PACEMAKER IMPLANT N/A 10/03/2020   Procedure: PACEMAKER IMPLANT - Dual Chamber;  Surgeon: Evans Lance, MD;  Location: [place] [facility];  Service: Cardiovascular;  Laterality: N/A;    Family History  Problem Relation Age of Onset  . Diabetes Brother   . Heart disease Brother   . Alzheimer's disease Mother   . Diabetes Father   . Stroke Father   . Diabetes Maternal Aunt     Social History   Socioeconomic History  . Marital status: Married    Spouse name: Not on file  . Number of children: Not on file  . Years of education: Not on file  .  Highest education level: Not on file  Occupational History  . Occupation: optemetrist  Tobacco Use  . Smoking status: Never Smoker  . Smokeless tobacco: Never Used  Vaping Use  . Vaping Use: Never used  Substance and Sexual Activity  . Alcohol use: Never  . Drug use: Never  . Sexual activity: Not on file  Other Topics Concern  . Not on file  Social History Narrative   Moved from [place].   Currently works as an Dietitian.  Married.    Social Determinants of Health   Financial Resource Strain: Not on file  Food Insecurity: Not on file  Transportation Needs: Not on file  Physical Activity: Not on file  Stress: Not on file  Social Connections: Not on file  Intimate Partner Violence: Not on file    Outpatient Medications Prior to Visit  Medication Sig Dispense Refill  . acetaminophen (TYLENOL) 325 MG tablet Take 650 mg by mouth every 6 (six) hours as needed.    . Cholecalciferol 50 MCG (2000 UT) TABS Take 2,000 Units by mouth daily.     . Coenzyme Q10 100 MG capsule Take 100 mg by mouth daily.     Marland Kitchen olmesartan (BENICAR) 20 MG tablet TAKE 1 TABLET BY MOUTH EVERY DAY. TAKE 1/2 OF A TABLET BY MOUTH FOR FIRST WEEK THEN INCREASE TO 1 TABLET 90 tablet 1  No facility-administered medications prior to visit.    Allergies  Allergen Reactions  . Ambien [Zolpidem Tartrate] Other (See Comments)    Delerious, hallucinations  . Zolpidem Other (See Comments)    hyper confusion Memory problems upset stomach Memory problems upset stomach   . Benadryl [Diphenhydramine] Other (See Comments)    shakes  . Lorazepam Other (See Comments)    Confusion Confusion   . Sulfamethoxazole-Trimethoprim Dermatitis, Itching and Rash    ROS Review of Systems  Constitutional: Negative for chills and fever.  HENT: Negative for congestion and sore throat.   Eyes: Negative for pain and discharge.  Respiratory: Negative for cough and shortness of breath.   Cardiovascular: Negative for chest  pain and palpitations.  Gastrointestinal: Negative for constipation, diarrhea, nausea and vomiting.  Endocrine: Negative for polydipsia and polyuria.  Genitourinary: Positive for difficulty urinating. Negative for dysuria and hematuria.  Musculoskeletal: Negative for neck pain and neck stiffness.  Skin: Negative for rash.  Neurological: Negative for dizziness, weakness and numbness.  Psychiatric/Behavioral: Negative for agitation and behavioral problems.      Objective:    Physical Exam Vitals reviewed.  Constitutional:      General: He is not in acute distress.    Appearance: He is not diaphoretic.  HENT:     Head: Normocephalic and atraumatic.     Nose: Nose normal.     Mouth/Throat:     Mouth: Mucous membranes are moist.  Eyes:     General: No scleral icterus.    Extraocular Movements: Extraocular movements intact.     Pupils: Pupils are equal, round, and reactive to light.  Cardiovascular:     Rate and Rhythm: Normal rate and regular rhythm.     Pulses: Normal pulses.     Heart sounds: Normal heart sounds. No murmur heard.   Pulmonary:     Breath sounds: Normal breath sounds. No wheezing or rales.  Abdominal:     Palpations: Abdomen is soft.     Tenderness: There is no abdominal tenderness.  Musculoskeletal:     Cervical back: Neck supple. No tenderness.     Right lower leg: No edema.     Left lower leg: No edema.  Skin:    General: Skin is warm.     Findings: No rash.  Neurological:     General: No focal deficit present.     Mental Status: He is alert and oriented to person, place, and time.     Sensory: No sensory deficit.     Motor: No weakness.  Psychiatric:        Mood and Affect: Mood normal.        Behavior: Behavior normal.     BP 118/68 (BP Location: Right Arm, Cuff Size: Normal)   Pulse 70   Temp 98.2 F (36.8 C) (Oral)   Resp 18   Ht 5\' 9"  (1.753 m)   Wt 144 lb (65.3 kg)   SpO2 94%   BMI 21.27 kg/m  Wt Readings from Last 3 Encounters:   02/24/21 144 lb (65.3 kg)  01/10/21 143 lb 12.8 oz (65.2 kg)  12/27/20 145 lb (65.8 kg)     Health Maintenance Due  Topic Date Due  . Hepatitis C Screening  Never done  . Zoster Vaccines- Shingrix (1 of 2) Never done    There are no preventive care reminders to display for this patient.  Lab Results  Component Value Date   TSH 2.219 01/03/2021   Lab Results  Component Value Date   WBC 2.7 (L) 01/03/2021   HGB 13.9 01/03/2021   HCT 41.9 01/03/2021   MCV 96.5 01/03/2021   PLT 110 (L) 01/03/2021   Lab Results  Component Value Date   NA 140 01/03/2021   K 4.7 01/03/2021   CO2 28 01/03/2021   GLUCOSE 83 01/03/2021   BUN 40 (H) 01/03/2021   CREATININE 1.09 01/03/2021   BILITOT 0.9 01/03/2021   ALKPHOS 85 01/03/2021   AST 30 01/03/2021   ALT 26 01/03/2021   PROT 6.3 (L) 01/03/2021   ALBUMIN 4.5 01/03/2021   CALCIUM 9.3 01/03/2021   ANIONGAP 9 01/03/2021   Lab Results  Component Value Date   CHOL 148 01/03/2021   Lab Results  Component Value Date   HDL 63 01/03/2021   Lab Results  Component Value Date   LDLCALC 77 01/03/2021   Lab Results  Component Value Date   TRIG 41 01/03/2021   Lab Results  Component Value Date   CHOLHDL 2.3 01/03/2021   Lab Results  Component Value Date   HGBA1C 4.3 (L) 01/03/2021      Assessment & Plan:   Problem List Items Addressed This Visit      Cardiovascular and Mediastinum   Mobitz type 2 second degree atrioventricular block    S/p pacemaker placement Follows up with Cardiology      Primary hypertension - Primary    BP Readings from Last 1 Encounters:  02/24/21 118/68   Well-controlled with Olmesartan 20 mg QD Counseled for compliance with the medications Advised DASH diet and moderate exercise/walking, at least 150 mins/week         Immune and Lymphatic   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 We will help him with the records while transitioning care.  No orders of the defined types were placed in this encounter.   Follow-up: No follow-ups on file.    Lindell Spar, MD

## 2021-02-24 NOTE — Assessment & Plan Note (Signed)
BP Readings from Last 1 Encounters:  02/24/21 118/68   Well-controlled with Olmesartan 20 mg QD Counseled for compliance with the medications Advised DASH diet and moderate exercise/walking, at least 150 mins/week

## 2021-02-24 NOTE — Assessment & Plan Note (Signed)
S/p pacemaker placement Follows up with Cardiology

## 2021-02-24 NOTE — Patient Instructions (Signed)
Please continue same medications for now.  Please continue to follow heart healthy diet.  It was a pleasure meeting you and taking care of you! Please contact us if you need any help while transitioning care to a new provider in [place].

## 2021-02-24 NOTE — Assessment & Plan Note (Signed)
S/p chemotherapy Follows up with Oncology

## 2021-03-13 ENCOUNTER — Ambulatory Visit (INDEPENDENT_AMBULATORY_CARE_PROVIDER_SITE_OTHER): Payer: Medicare Other | Admitting: Nurse Practitioner

## 2021-03-13 ENCOUNTER — Other Ambulatory Visit: Payer: Self-pay

## 2021-03-13 ENCOUNTER — Encounter: Payer: Self-pay | Admitting: Nurse Practitioner

## 2021-03-13 VITALS — BP 146/71 | HR 74 | Temp 98.4°F | Resp 18 | Ht 69.0 in | Wt 141.0 lb

## 2021-03-13 DIAGNOSIS — M542 Cervicalgia: Secondary | ICD-10-CM

## 2021-03-13 DIAGNOSIS — I10 Essential (primary) hypertension: Secondary | ICD-10-CM | POA: Diagnosis not present

## 2021-03-13 DIAGNOSIS — N390 Urinary tract infection, site not specified: Secondary | ICD-10-CM | POA: Diagnosis not present

## 2021-03-13 DIAGNOSIS — R319 Hematuria, unspecified: Secondary | ICD-10-CM

## 2021-03-13 MED ORDER — METHOCARBAMOL 750 MG PO TABS: 750.0000 mg | ORAL_TABLET | Freq: Four times a day (QID) | ORAL | 0 refills | Status: AC

## 2021-03-13 MED ORDER — CIPROFLOXACIN HCL 500 MG PO TABS: 500.0000 mg | ORAL_TABLET | Freq: Two times a day (BID) | ORAL | 0 refills | Status: AC

## 2021-03-13 NOTE — Assessment & Plan Note (Signed)
-  UTI on U/A -Rx. Cipro; he has sulfa allergy, so no bactrim

## 2021-03-13 NOTE — Assessment & Plan Note (Signed)
-  has mod-severe kyphosis -having neck pain worse when turning left -will treat for muscle strain; although he has erythema to right shoulder and pain to left neck -Rx. Tizanidine; if no improvement will get ortho consult

## 2021-03-13 NOTE — Progress Notes (Signed)
Acute Office Visit  Subjective:    Patient ID: Dennis Molina, male    DOB: [birth year], 80 y.o.   MRN: [id number]  Chief Complaint  Patient presents with   Shoulder Pain    X 1 week    Neck Pain    X 1 week    Hematuria    X 1 week    Headache    Scalp sensitivity on L side x 1 week    Fatigue    Shoulder Pain   Neck Pain  Associated symptoms include headaches.  Hematuria  Headache  Associated symptoms include neck pain.  Patient is in today for UTI. He has had blood in his urine, fatigue, confusion, and neck pain x 1 week. He has redness to his right shoulder and neck, but has left shoulder pain. This has been ongoing for a little over a week.  Past Medical History:  Diagnosis Date   BPH (benign prostatic hyperplasia)    NHL (non-Hodgkin's lymphoma) [facility])     Past Surgical History:  Procedure Laterality Date   HERNIA REPAIR  1980's   PACEMAKER IMPLANT N/A 10/03/2020   Procedure: PACEMAKER IMPLANT - Dual Chamber;  Surgeon: Evans Lance, MD;  Location: [place] [facility];  Service: Cardiovascular;  Laterality: N/A;    Family History  Problem Relation Age of Onset   Diabetes Brother    Heart disease Brother    Alzheimer's disease Mother    Diabetes Father    Stroke Father    Diabetes Maternal Aunt     Social History   Socioeconomic History   Marital status: Married    Spouse name: Not on file   Number of children: Not on file   Years of education: Not on file   Highest education level: Not on file  Occupational History   Occupation: optemetrist  Tobacco Use   Smoking status: Never   Smokeless tobacco: Never  Vaping Use   Vaping Use: Never used  Substance and Sexual Activity   Alcohol use: Never   Drug use: Never   Sexual activity: Not on file  Other Topics Concern   Not on file  Social History Narrative   Moved from [place].   Currently works as an Dietitian.  Married.    Social Determinants of Health   Financial Resource Strain: Not on  file  Food Insecurity: Not on file  Transportation Needs: Not on file  Physical Activity: Not on file  Stress: Not on file  Social Connections: Not on file  Intimate Partner Violence: Not on file    Outpatient Medications Prior to Visit  Medication Sig Dispense Refill   acetaminophen (TYLENOL) 325 MG tablet Take 650 mg by mouth every 6 (six) hours as needed.     Cholecalciferol 50 MCG (2000 UT) TABS Take 2,000 Units by mouth daily.      Coenzyme Q10 100 MG capsule Take 100 mg by mouth daily.      olmesartan (BENICAR) 20 MG tablet TAKE 1 TABLET BY MOUTH EVERY DAY. TAKE 1/2 OF A TABLET BY MOUTH FOR FIRST WEEK THEN INCREASE TO 1 TABLET 90 tablet 1   No facility-administered medications prior to visit.    Allergies  Allergen Reactions   Ambien [Zolpidem Tartrate] Other (See Comments)    Delerious, hallucinations   Zolpidem Other (See Comments)    hyper confusion Memory problems upset stomach Memory problems upset stomach    Benadryl [Diphenhydramine] Other (See Comments)    shakes  Lorazepam Other (See Comments)    Confusion Confusion    Sulfamethoxazole-Trimethoprim Dermatitis, Itching and Rash    Review of Systems  Constitutional: Negative.   Genitourinary:  Positive for hematuria.  Musculoskeletal:  Positive for neck pain.  Neurological:  Positive for headaches.      Objective:    Physical Exam Constitutional:      Appearance: He is well-developed.  Musculoskeletal:        General: No tenderness.     Comments: Pain with neck ROM when rotating head left; kyphotic spine  Neurological:     Mental Status: He is alert.    BP (!) 146/71 (BP Location: Right Arm, Patient Position: Sitting, Cuff Size: Normal)   Pulse 74   Temp 98.4 F (36.9 C) (Oral)   Resp 18   Ht 5\' 9"  (1.753 m)   Wt 141 lb (64 kg)   SpO2 97%   BMI 20.82 kg/m  Wt Readings from Last 3 Encounters:  03/13/21 141 lb (64 kg)  02/24/21 144 lb (65.3 kg)  01/10/21 143 lb 12.8 oz (65.2 kg)     Health Maintenance Due  Topic Date Due   Hepatitis C Screening  Never done   Zoster Vaccines- Shingrix (1 of 2) Never done    There are no preventive care reminders to display for this patient.   Lab Results  Component Value Date   TSH 2.219 01/03/2021   Lab Results  Component Value Date   WBC 2.7 (L) 01/03/2021   HGB 13.9 01/03/2021   HCT 41.9 01/03/2021   MCV 96.5 01/03/2021   PLT 110 (L) 01/03/2021   Lab Results  Component Value Date   NA 140 01/03/2021   K 4.7 01/03/2021   CO2 28 01/03/2021   GLUCOSE 83 01/03/2021   BUN 40 (H) 01/03/2021   CREATININE 1.09 01/03/2021   BILITOT 0.9 01/03/2021   ALKPHOS 85 01/03/2021   AST 30 01/03/2021   ALT 26 01/03/2021   PROT 6.3 (L) 01/03/2021   ALBUMIN 4.5 01/03/2021   CALCIUM 9.3 01/03/2021   ANIONGAP 9 01/03/2021   Lab Results  Component Value Date   CHOL 148 01/03/2021   Lab Results  Component Value Date   HDL 63 01/03/2021   Lab Results  Component Value Date   LDLCALC 77 01/03/2021   Lab Results  Component Value Date   TRIG 41 01/03/2021   Lab Results  Component Value Date   CHOLHDL 2.3 01/03/2021   Lab Results  Component Value Date   HGBA1C 4.3 (L) 01/03/2021       Assessment & Plan:   Problem List Items Addressed This Visit       Cardiovascular and Mediastinum   Primary hypertension    -he stopped taking olmesartan about a week ago when he developed UTI and red rash to shoulders -will re-eval BP  In 1 week         Genitourinary   Urinary tract infection, site not specified - Primary    -UTI on U/A -Rx. Cipro; he has sulfa allergy, so no bactrim       Relevant Medications   ciprofloxacin (CIPRO) 500 MG tablet   Other Relevant Orders   Urine Culture     Other   Neck pain    -has mod-severe kyphosis -having neck pain worse when turning left -will treat for muscle strain; although he has erythema to right shoulder and pain to left neck -Rx. Tizanidine; if no improvement will  get ortho  consult         Meds ordered this encounter  Medications   ciprofloxacin (CIPRO) 500 MG tablet    Sig: Take 1 tablet (500 mg total) by mouth 2 (two) times daily for 5 days.    Dispense:  10 tablet    Refill:  0   methocarbamol (ROBAXIN-750) 750 MG tablet    Sig: Take 1 tablet (750 mg total) by mouth 4 (four) times daily.    Dispense:  30 tablet    Refill:  0     Noreene Larsson, NP

## 2021-03-13 NOTE — Assessment & Plan Note (Signed)
-  he stopped taking olmesartan about a week ago when he developed UTI and red rash to shoulders -will re-eval BP  In 1 week

## 2021-03-18 LAB — URINE CULTURE

## 2021-03-20 ENCOUNTER — Telehealth: Payer: Self-pay

## 2021-03-20 NOTE — Telephone Encounter (Signed)
Patient called left vocie mail,  returning call about lab results.

## 2021-03-20 NOTE — Progress Notes (Signed)
The urine culture grew klebsiella, but the cipro that we sent in should do the trick. No need to add/change medicines.

## 2021-03-21 NOTE — Progress Notes (Signed)
Pt informed

## 2021-03-22 ENCOUNTER — Encounter: Payer: Self-pay | Admitting: Nurse Practitioner

## 2021-03-22 ENCOUNTER — Ambulatory Visit (INDEPENDENT_AMBULATORY_CARE_PROVIDER_SITE_OTHER): Payer: Medicare Other | Admitting: Nurse Practitioner

## 2021-03-22 ENCOUNTER — Other Ambulatory Visit: Payer: Self-pay

## 2021-03-22 DIAGNOSIS — I10 Essential (primary) hypertension: Secondary | ICD-10-CM

## 2021-03-22 MED ORDER — HYDROCHLOROTHIAZIDE 12.5 MG PO TABS: 12.5000 mg | ORAL_TABLET | Freq: Every day | ORAL | 3 refills | Status: AC

## 2021-03-22 NOTE — Assessment & Plan Note (Signed)
BP Readings from Last 3 Encounters:  03/22/21 (!) 165/77  03/13/21 (!) 146/71  02/24/21 118/68  -BP elevated today without his olmesartan -home BP readings at 130/70s in AM and high 90s/50-60s in the PM -he would like to start HCTZ since he took this in the past -Rx. HCTZ

## 2021-03-22 NOTE — Patient Instructions (Signed)
It was a pleasure taking care of you!  I wish you two the best on your move to [place].  We will send medical records as soon as we get a request from your new primary care provider.

## 2021-03-22 NOTE — Progress Notes (Signed)
Acute Office Visit  Subjective:    Patient ID: Dennis Molina, male    DOB: [birth year], 80 y.o.   MRN: [id number]  Chief Complaint  Patient presents with   Urinary Tract Infection    Follow up, has resolved.   Neck Pain    Follow up, has resolved.     Urinary Tract Infection   Neck Pain   Patient is in today for BP check.  He stopped taking lomesartan about 2 weeks ago when he developed a UTI and red rash to his shoulders. Here today to recheck his BP to make sure he doesn't have HTN after his UTI has resolved.  His BP is elevated   We treated the UTI with cipro, and culture shows that the Klebsiella that grew is sensitive to this antibiotic . He states the blood in his urine has resolved.  Past Medical History:  Diagnosis Date   BPH (benign prostatic hyperplasia)    NHL (non-Hodgkin's lymphoma) [facility])     Past Surgical History:  Procedure Laterality Date   HERNIA REPAIR  1980's   PACEMAKER IMPLANT N/A 10/03/2020   Procedure: PACEMAKER IMPLANT - Dual Chamber;  Surgeon: Evans Lance, MD;  Location: [place] [facility];  Service: Cardiovascular;  Laterality: N/A;    Family History  Problem Relation Age of Onset   Diabetes Brother    Heart disease Brother    Alzheimer's disease Mother    Diabetes Father    Stroke Father    Diabetes Maternal Aunt     Social History   Socioeconomic History   Marital status: Married    Spouse name: Not on file   Number of children: Not on file   Years of education: Not on file   Highest education level: Not on file  Occupational History   Occupation: optemetrist  Tobacco Use   Smoking status: Never   Smokeless tobacco: Never  Vaping Use   Vaping Use: Never used  Substance and Sexual Activity   Alcohol use: Never   Drug use: Never   Sexual activity: Not on file  Other Topics Concern   Not on file  Social History Narrative   Moved from [place].   Currently works as an Dietitian.  Married.    Social Determinants of  Health   Financial Resource Strain: Not on file  Food Insecurity: Not on file  Transportation Needs: Not on file  Physical Activity: Not on file  Stress: Not on file  Social Connections: Not on file  Intimate Partner Violence: Not on file    Outpatient Medications Prior to Visit  Medication Sig Dispense Refill   acetaminophen (TYLENOL) 325 MG tablet Take 650 mg by mouth every 6 (six) hours as needed.     Cholecalciferol 50 MCG (2000 UT) TABS Take 2,000 Units by mouth daily.      Coenzyme Q10 100 MG capsule Take 100 mg by mouth daily.      methocarbamol (ROBAXIN-750) 750 MG tablet Take 1 tablet (750 mg total) by mouth 4 (four) times daily. 30 tablet 0   olmesartan (BENICAR) 20 MG tablet TAKE 1 TABLET BY MOUTH EVERY DAY. TAKE 1/2 OF A TABLET BY MOUTH FOR FIRST WEEK THEN INCREASE TO 1 TABLET (Patient not taking: Reported on 03/22/2021) 90 tablet 1   No facility-administered medications prior to visit.    Allergies  Allergen Reactions   Ambien [Zolpidem Tartrate] Other (See Comments)    Delerious, hallucinations   Zolpidem Other (See Comments)  hyper confusion Memory problems upset stomach Memory problems upset stomach    Benadryl [Diphenhydramine] Other (See Comments)    shakes   Lorazepam Other (See Comments)    Confusion Confusion    Sulfamethoxazole-Trimethoprim Dermatitis, Itching and Rash    Review of Systems  Constitutional: Negative.   Respiratory: Negative.    Cardiovascular: Negative.   Genitourinary: Negative.        Self-caths to urinate  Musculoskeletal:  Positive for neck pain.  Psychiatric/Behavioral: Negative.        Objective:    Physical Exam Constitutional:      Appearance: Normal appearance.  Cardiovascular:     Rate and Rhythm: Normal rate and regular rhythm.     Pulses: Normal pulses.     Heart sounds: Normal heart sounds.  Pulmonary:     Effort: Pulmonary effort is normal.     Breath sounds: Normal breath sounds.  Neurological:      Mental Status: He is alert.  Psychiatric:        Mood and Affect: Mood normal.        Behavior: Behavior normal.        Thought Content: Thought content normal.        Judgment: Judgment normal.    BP (!) 165/77 (BP Location: Right Arm, Patient Position: Sitting, Cuff Size: Normal)   Pulse 66   Ht 5\' 9"  (1.753 m)   Wt 141 lb (64 kg)   SpO2 96%   BMI 20.82 kg/m  Wt Readings from Last 3 Encounters:  03/22/21 141 lb (64 kg)  03/13/21 141 lb (64 kg)  02/24/21 144 lb (65.3 kg)    Health Maintenance Due  Topic Date Due   Hepatitis C Screening  Never done   Zoster Vaccines- Shingrix (1 of 2) Never done    There are no preventive care reminders to display for this patient.   Lab Results  Component Value Date   TSH 2.219 01/03/2021   Lab Results  Component Value Date   WBC 2.7 (L) 01/03/2021   HGB 13.9 01/03/2021   HCT 41.9 01/03/2021   MCV 96.5 01/03/2021   PLT 110 (L) 01/03/2021   Lab Results  Component Value Date   NA 140 01/03/2021   K 4.7 01/03/2021   CO2 28 01/03/2021   GLUCOSE 83 01/03/2021   BUN 40 (H) 01/03/2021   CREATININE 1.09 01/03/2021   BILITOT 0.9 01/03/2021   ALKPHOS 85 01/03/2021   AST 30 01/03/2021   ALT 26 01/03/2021   PROT 6.3 (L) 01/03/2021   ALBUMIN 4.5 01/03/2021   CALCIUM 9.3 01/03/2021   ANIONGAP 9 01/03/2021   Lab Results  Component Value Date   CHOL 148 01/03/2021   Lab Results  Component Value Date   HDL 63 01/03/2021   Lab Results  Component Value Date   LDLCALC 77 01/03/2021   Lab Results  Component Value Date   TRIG 41 01/03/2021   Lab Results  Component Value Date   CHOLHDL 2.3 01/03/2021   Lab Results  Component Value Date   HGBA1C 4.3 (L) 01/03/2021       Assessment & Plan:   Problem List Items Addressed This Visit       Cardiovascular and Mediastinum   Primary hypertension    BP Readings from Last 3 Encounters:  03/22/21 (!) 165/77  03/13/21 (!) 146/71  02/24/21 118/68  -BP elevated today  without his olmesartan -home BP readings at 130/70s in AM and high 90s/50-60s in the  PM -he would like to start HCTZ since he took this in the past -Rx. HCTZ        Relevant Medications   hydrochlorothiazide (HYDRODIURIL) 12.5 MG tablet     Meds ordered this encounter  Medications   hydrochlorothiazide (HYDRODIURIL) 12.5 MG tablet    Sig: Take 1 tablet (12.5 mg total) by mouth daily.    Dispense:  90 tablet    Refill:  Bradford Woods, NP

## 2021-03-30 ENCOUNTER — Emergency Department (HOSPITAL_COMMUNITY): Payer: Medicare Other

## 2021-03-30 ENCOUNTER — Encounter: Payer: Self-pay | Admitting: Nurse Practitioner

## 2021-03-30 ENCOUNTER — Other Ambulatory Visit: Payer: Self-pay

## 2021-03-30 ENCOUNTER — Ambulatory Visit (INDEPENDENT_AMBULATORY_CARE_PROVIDER_SITE_OTHER): Payer: Medicare Other | Admitting: Nurse Practitioner

## 2021-03-30 ENCOUNTER — Emergency Department (HOSPITAL_COMMUNITY)
Admission: EM | Admit: 2021-03-30 | Discharge: 2021-03-30 | Disposition: A | Discharge status: Home / Self Care | Payer: Medicare Other | Attending: Emergency Medicine | Admitting: Emergency Medicine

## 2021-03-30 ENCOUNTER — Encounter (HOSPITAL_COMMUNITY): Payer: Self-pay | Admitting: *Deleted

## 2021-03-30 DIAGNOSIS — M25519 Pain in unspecified shoulder: Secondary | ICD-10-CM | POA: Diagnosis not present

## 2021-03-30 DIAGNOSIS — R911 Solitary pulmonary nodule: Secondary | ICD-10-CM | POA: Diagnosis not present

## 2021-03-30 DIAGNOSIS — J9811 Atelectasis: Secondary | ICD-10-CM | POA: Diagnosis not present

## 2021-03-30 DIAGNOSIS — Z20822 Contact with and (suspected) exposure to covid-19: Secondary | ICD-10-CM | POA: Insufficient documentation

## 2021-03-30 DIAGNOSIS — Z79899 Other long term (current) drug therapy: Secondary | ICD-10-CM | POA: Insufficient documentation

## 2021-03-30 DIAGNOSIS — K5790 Diverticulosis of intestine, part unspecified, without perforation or abscess without bleeding: Secondary | ICD-10-CM | POA: Diagnosis not present

## 2021-03-30 DIAGNOSIS — N281 Cyst of kidney, acquired: Secondary | ICD-10-CM | POA: Diagnosis not present

## 2021-03-30 DIAGNOSIS — I10 Essential (primary) hypertension: Secondary | ICD-10-CM | POA: Insufficient documentation

## 2021-03-30 DIAGNOSIS — Z950 Presence of cardiac pacemaker: Secondary | ICD-10-CM | POA: Insufficient documentation

## 2021-03-30 DIAGNOSIS — R1110 Vomiting, unspecified: Secondary | ICD-10-CM | POA: Diagnosis not present

## 2021-03-30 DIAGNOSIS — R1011 Right upper quadrant pain: Secondary | ICD-10-CM | POA: Insufficient documentation

## 2021-03-30 LAB — COMPREHENSIVE METABOLIC PANEL
ALT: 19 U/L (ref 0–44)
AST: 25 U/L (ref 15–41)
Albumin: 4.1 g/dL (ref 3.5–5.0)
Alkaline Phosphatase: 70 U/L (ref 38–126)
Anion gap: 9 (ref 5–15)
BUN: 21 mg/dL (ref 8–23)
CO2: 28 mmol/L (ref 22–32)
Calcium: 8.9 mg/dL (ref 8.9–10.3)
Chloride: 94 mmol/L — ABNORMAL LOW (ref 98–111)
Creatinine, Ser: 0.84 mg/dL (ref 0.61–1.24)
GFR, Estimated: >60 mL/min (ref >60)
Glucose, Bld: 94 mg/dL (ref 70–99)
Potassium: 4.3 mmol/L (ref 3.5–5.1)
Sodium: 131 mmol/L — ABNORMAL LOW (ref 135–145)
Total Bilirubin: 1.1 mg/dL (ref 0.3–1.2)
Total Protein: 5.9 g/dL — ABNORMAL LOW (ref 6.5–8.1)

## 2021-03-30 LAB — RESP PANEL BY RT-PCR (FLU A&B, COVID) ARPGX2
Influenza A by PCR: NEGATIVE
Influenza B by PCR: NEGATIVE
SARS Coronavirus 2 by RT PCR: NEGATIVE

## 2021-03-30 LAB — URINALYSIS, ROUTINE W REFLEX MICROSCOPIC
Bilirubin Urine: NEGATIVE
Glucose, UA: NEGATIVE mg/dL
Hgb urine dipstick: NEGATIVE
Ketones, ur: 20 mg/dL — AB
Leukocytes,Ua: NEGATIVE
Nitrite: NEGATIVE
Protein, ur: NEGATIVE mg/dL
Specific Gravity, Urine: 1.014 (ref 1.005–1.030)
pH: 6 (ref 5.0–8.0)

## 2021-03-30 LAB — CBC WITH DIFFERENTIAL/PLATELET
Abs Immature Granulocytes: 0.02 10*3/uL (ref 0.00–0.07)
Basophils Absolute: 0 10*3/uL (ref 0.0–0.1)
Basophils Relative: 0 %
Eosinophils Absolute: 0 10*3/uL (ref 0.0–0.5)
Eosinophils Relative: 0 %
HCT: 39.9 % (ref 39.0–52.0)
Hemoglobin: 13.7 g/dL (ref 13.0–17.0)
Immature Granulocytes: 0 %
Lymphocytes Relative: 10 %
Lymphs Abs: 0.5 10*3/uL — ABNORMAL LOW (ref 0.7–4.0)
MCH: 31.4 pg (ref 26.0–34.0)
MCHC: 34.3 g/dL (ref 30.0–36.0)
MCV: 91.5 fL (ref 80.0–100.0)
Monocytes Absolute: 0.3 10*3/uL (ref 0.1–1.0)
Monocytes Relative: 6 %
Neutro Abs: 4.2 10*3/uL (ref 1.7–7.7)
Neutrophils Relative %: 84 %
Platelets: 124 10*3/uL — ABNORMAL LOW (ref 150–400)
RBC: 4.36 MIL/uL (ref 4.22–5.81)
RDW: 14.3 % (ref 11.5–15.5)
WBC: 5.1 10*3/uL (ref 4.0–10.5)
nRBC: 0 % (ref 0.0–0.2)

## 2021-03-30 LAB — LIPASE, BLOOD: Lipase: 28 U/L (ref 11–51)

## 2021-03-30 LAB — TROPONIN I (HIGH SENSITIVITY)
Troponin I (High Sensitivity): 10 ng/L (ref <18)
Troponin I (High Sensitivity): 10 ng/L (ref <18)

## 2021-03-30 LAB — PROTIME-INR
INR: 1 (ref 0.8–1.2)
Prothrombin Time: 13.6 seconds (ref 11.4–15.2)

## 2021-03-30 MED ORDER — MORPHINE SULFATE (PF) 4 MG/ML IV SOLN
4.0000 mg | Freq: Once | INTRAVENOUS | Status: AC
  Administered 2021-03-30: 2 mg via INTRAVENOUS
  Filled 2021-03-30: qty 1

## 2021-03-30 MED ORDER — IOHEXOL 300 MG/ML  SOLN
100.0000 mL | Freq: Once | INTRAMUSCULAR | Status: AC | PRN
  Administered 2021-03-30: 100 mL via INTRAVENOUS

## 2021-03-30 MED ORDER — MORPHINE SULFATE (PF) 2 MG/ML IV SOLN
2.0000 mg | Freq: Once | INTRAVENOUS | Status: AC
  Administered 2021-03-30: 2 mg via INTRAVENOUS
  Filled 2021-03-30: qty 1

## 2021-03-30 MED ORDER — ONDANSETRON HCL 4 MG/2ML IJ SOLN
4.0000 mg | Freq: Once | INTRAMUSCULAR | Status: AC | PRN
  Administered 2021-03-30: 4 mg via INTRAVENOUS
  Filled 2021-03-30: qty 2

## 2021-03-30 MED ORDER — HYDROCODONE-ACETAMINOPHEN 5-325 MG PO TABS: 1.0000 | ORAL_TABLET | Freq: Four times a day (QID) | ORAL | 0 refills | Status: AC | PRN

## 2021-03-30 MED ORDER — FENTANYL CITRATE (PF) 100 MCG/2ML IJ SOLN
50.0000 ug | Freq: Once | INTRAMUSCULAR | Status: AC
  Administered 2021-03-30: 50 ug via INTRAVENOUS
  Filled 2021-03-30: qty 2

## 2021-03-30 NOTE — Discharge Instructions (Addendum)
  IMPRESSION:  1. No acute intra-abdominal or pelvic pathology.  2. Large amount of stool throughout the colon. No bowel obstruction.  3. Colonic diverticulosis.  4. Splenomegaly.  5. Bilateral renal cysts.  6. Enlarged and heterogeneous prostate gland with postsurgical  changes of TURP.  7. Partially visualized cluster of nodularity in the right middle  lobe. Dedicated chest CT may provide better evaluation on a  nonemergent/outpatient basis.  8. Aortic Atherosclerosis (ICD10-I70.0  Please do not eat after midnight until your ultrasound unless otherwise directed.   IMPORTANT PATIENT INSTRUCTIONS:  Your ED provider has recommended an Outpatient Ultrasound.  Please call [phone] to schedule an appointment.  If your appointment is scheduled for a Saturday, Sunday or holiday, please go to the [facility] Emergency Department Registration Desk at least 15 minutes prior to your appointment time and tell them you are there for an ultrasound.    If your appointment is scheduled for a weekday (Monday-Friday), please go directly to the [facility] Radiology Department at least 15 minutes prior to your appointment time and tell them you are there for an ultrasound.  Please call [phone] with questions.  Today you received medications that may make you sleepy or impair your ability to make decisions.  For the next 24 hours please do not drive, operate heavy machinery, care for a small child with out another adult present, or perform any activities that may cause harm to you or someone else if you were to fall asleep or be impaired.   You are being prescribed a medication which may make you sleepy. Please follow up of listed precautions for at least 24 hours after taking one dose.

## 2021-03-30 NOTE — ED Triage Notes (Signed)
Pain in right upper quadrant, history of gallbladder disease

## 2021-03-30 NOTE — ED Provider Notes (Signed)
[place] Provider Note   CSN: [id number] Arrival date & time: 03/30/21  1334     History Chief Complaint  Patient presents with   Abdominal Pain    Dennis Molina is a 80 y.o. 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 He states that about 1 week ago he developed pain in both of his posterior shoulders, neck, and ears.  He denies any nasal congestion.  He states that about 3 days ago he developed right upper quadrant abdominal pain and yesterday he developed nausea and vomiting.  The pain is severe enough that he is unable to sleep.  He denies any diarrhea. He was seen by PCP earlier today and sent here for concern of hepatobiliary pathology.    No fevers  HPI     Past Medical History:  Diagnosis Date   BPH (benign prostatic hyperplasia)    NHL (non-Hodgkin's lymphoma) (HCC)     Patient Active Problem List   Diagnosis Date Noted   RUQ pain 03/30/2021   Diverticulosis 03/30/2021   Nodule of middle lobe of right lung 03/30/2021   Urinary tract infection, site not specified 03/13/2021   Neck pain 03/13/2021   Combined forms of age-related cataract, bilateral 02/24/2021   Deposits (accretions) on teeth 02/24/2021   Hematuria, unspecified 02/24/2021   Hereditary and idiopathic peripheral neuropathy 02/24/2021   Other specified disorders of urinary system 02/24/2021   Perceptive hearing loss, both sides 02/24/2021   Thrombocytopenia, unspecified ([place]) 02/24/2021   Tinnitus 02/24/2021   Sinusitis 10/20/2020   Primary hypertension 10/20/2020   Mobitz type 2 second degree atrioventricular block 10/03/2020   Neuromuscular dysfunction of bladder, unspecified 03/23/2014   Diffuse large B-cell lymphoma of lymph nodes of multiple sites ([place]) 07/01/2013    Renal cysts, acquired, bilateral 04/22/2013   Kyphoscoliosis deformity of spine 03/16/2013   Other psoriasis 08/14/2009   Hypogammaglobulinemia ([place]) 06/28/2009   Other ill-defined and unknown causes of morbidity and mortality 06/23/2009   Urinary retention 06/21/2009   Hypertrophy of prostate with urinary obstruction and other lower urinary tract symptoms (LUTS) 04/04/2009   Esophageal reflux 04/04/2009   Heart hypertrophy 04/04/2009   Lymphoma ([place]) 04/04/2009   Neoplasm of unspecified nature of endocrine glands and other parts of nervous system 04/04/2009   Neuropathy involving both lower extremities 03/23/2009   Nontoxic single thyroid nodule 03/23/2009   Vitamin D deficiency 91/63/8466   Follicular lymphoma, unspecified, 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    Past Surgical History:  Procedure Laterality Date   HERNIA REPAIR  1980's   PACEMAKER IMPLANT N/A 10/03/2020   Procedure: PACEMAKER IMPLANT - Dual Chamber;  Surgeon: Evans Lance, MD;  Location: [facility];  Service: Cardiovascular;  Laterality: N/A;       Family History  Problem Relation Age of Onset   Diabetes Brother    Heart disease Brother    Alzheimer's disease Mother    Diabetes Father    Stroke Father    Diabetes Maternal Aunt     Social History   Tobacco Use   Smoking status: Never   Smokeless tobacco: Never  Vaping Use   Vaping Use: Never used  Substance Use Topics   Alcohol use: Never   Drug use: Never    Home Medications Prior to Admission medications   Medication  Sig Start Date End Date Taking? Authorizing Provider  HYDROcodone-acetaminophen (NORCO/VICODIN) 5-325 MG tablet Take 1 tablet by mouth every 6 (six) hours as needed for severe pain. 03/30/21  Yes Lorin Glass, PA-C  acetaminophen (TYLENOL) 325 MG tablet Take 650 mg by mouth every 6 (six) hours as needed.    [provider]  Cholecalciferol 50 MCG (2000 UT) TABS Take 2,000 Units by mouth daily.     [provider]  Coenzyme Q10 100 MG capsule Take 100 mg by mouth daily.     [provider]  hydrochlorothiazide (HYDRODIURIL) 12.5 MG tablet Take 1 tablet (12.5 mg total) by mouth daily. 03/22/21   Noreene Larsson, NP  methocarbamol (ROBAXIN-750) 750 MG tablet Take 1 tablet (750 mg total) by mouth 4 (four) times daily. 03/13/21   Noreene Larsson, NP    Allergies    Ambien [zolpidem tartrate], Zolpidem, Benadryl [diphenhydramine], Lorazepam, and Sulfamethoxazole-trimethoprim  Review of Systems   Review of Systems  Constitutional:  Negative for chills, fatigue and fever.  HENT:  Positive for ear pain. Negative for congestion.   Respiratory:  Negative for chest tightness and shortness of breath.   Cardiovascular:  Positive for chest pain (More of bilateral shoulder /back pain).  Gastrointestinal:  Positive for abdominal pain, nausea and vomiting. Negative for constipation and diarrhea.  Genitourinary:  Negative for dysuria.  Musculoskeletal:  Positive for back pain.  Skin:  Negative for color change.  Neurological:  Negative for weakness and headaches.  Psychiatric/Behavioral:  Negative for confusion.   All other systems reviewed and are negative.  Physical Exam Updated Vital Signs BP (!) 148/68   Pulse 86   Temp (!) 97.4 F (36.3 C) (Oral)   Resp (!) 28   SpO2 95%   Physical Exam Vitals and nursing note reviewed.  Constitutional:      General: He is not in acute distress.    Appearance: He is not ill-appearing.  HENT:     Head: Atraumatic.  Eyes:     Conjunctiva/sclera: Conjunctivae normal.  Cardiovascular:     Rate and Rhythm: Normal rate.     Heart sounds: Normal heart sounds.  Pulmonary:     Effort: Pulmonary effort is normal. No respiratory distress.     Breath sounds: Normal breath sounds.  Abdominal:     General: Bowel sounds are normal. There is no distension.     Palpations: Abdomen is soft.     Tenderness: There is abdominal tenderness in the right upper  quadrant.     Hernia: No hernia is present.  Musculoskeletal:     Cervical back: Normal range of motion and neck supple.     Comments: No obvious acute injury  Skin:    General: Skin is warm.  Neurological:     Mental Status: He is alert.     Comments: Awake and alert, answers all questions appropriately.  Speech is not slurred.  Psychiatric:        Mood and Affect: Mood normal.        Behavior: Behavior normal.    ED Results / Procedures / Treatments   Labs (all labs ordered are listed, but only abnormal results are displayed) Labs Reviewed  COMPREHENSIVE METABOLIC PANEL - Abnormal; Notable for the following components:      Result Value   Sodium 131 (*)    Chloride 94 (*)    Total Protein 5.9 (*)    All other components within normal limits  CBC WITH  DIFFERENTIAL/PLATELET - Abnormal; Notable for the following components:   Platelets 124 (*)    Lymphs Abs 0.5 (*)    All other components within normal limits  URINALYSIS, ROUTINE W REFLEX MICROSCOPIC - Abnormal; Notable for the following components:   APPearance HAZY (*)    Ketones, ur 20 (*)    All other components within normal limits  RESP PANEL BY RT-PCR (FLU A&B, COVID) ARPGX2  LIPASE, BLOOD  PROTIME-INR  TROPONIN I (HIGH SENSITIVITY)  TROPONIN I (HIGH SENSITIVITY)    EKG None  Radiology DG Chest 2 View  Result Date: 03/30/2021 CLINICAL DATA:  Right upper quadrant pain. EXAM: CHEST - 2 VIEW COMPARISON:  October 03, 2020 FINDINGS: A dual lead AICD is in place. Very mild atelectasis is seen within the left lung base. There is no evidence of a pleural effusion or pneumothorax. The heart size and mediastinal contours are within normal limits. The visualized skeletal structures are unremarkable. IMPRESSION: Very mild left basilar atelectasis. Electronically Signed   By: Virgina Norfolk M.D.   On: 03/30/2021 16:23   CT ABDOMEN PELVIS W CONTRAST  Result Date: 03/30/2021 CLINICAL DATA:  80 year old male with history of  lymphoma. Nausea and vomiting. EXAM: CT ABDOMEN AND PELVIS WITH CONTRAST TECHNIQUE: Multidetector CT imaging of the abdomen and pelvis was performed using the standard protocol following bolus administration of intravenous contrast. CONTRAST:  180m OMNIPAQUE IOHEXOL 300 MG/ML  SOLN COMPARISON:  None. FINDINGS: Lower chest: Partially visualized cluster of nodularity in the right middle lobe (1/2). Dedicated chest CT may provide better evaluation on a nonemergent/outpatient basis. The visualized lung bases are otherwise clear. Cardiac pacemaker wire noted. No intra-abdominal free air or free fluid. Hepatobiliary: Multiple small hepatic hypodense lesions are not characterized, likely cysts. No intrahepatic biliary dilatation. The gallbladder is unremarkable. Pancreas: Unremarkable. No pancreatic ductal dilatation or surrounding inflammatory changes. Spleen: The spleen is enlarged measuring approximately 15 cm in length. Adrenals/Urinary Tract: The adrenal glands unremarkable. Bilateral renal cysts measure up to 11 cm in the upper pole of the right kidney and 14 cm in the upper pole of the left kidney. Additional smaller cysts noted. There is no hydronephrosis on either side. There is symmetric enhancement and excretion of contrast by both kidneys. The visualized ureters and urinary bladder appear unremarkable. Stomach/Bowel: There is large amount of stool throughout the colon. There is diffuse colonic diverticula without active inflammatory changes. There is no bowel obstruction. Apparent slight mild thickening in the region of the ileocecal valve. This area is poorly visualized. No evidence of acute appendicitis. Vascular/Lymphatic: Mild aortoiliac atherosclerotic disease. The IVC is unremarkable. No portal venous gas. There is no adenopathy. Reproductive: Enlarged and heterogeneous prostate gland measuring 5.5 cm in transverse axial diameter. There is postsurgical changes of TURP. Other: None Musculoskeletal:  Osteopenia with scoliosis and degenerative changes of the spine. No acute osseous pathology. IMPRESSION: 1. No acute intra-abdominal or pelvic pathology. 2. Large amount of stool throughout the colon. No bowel obstruction. 3. Colonic diverticulosis. 4. Splenomegaly. 5. Bilateral renal cysts. 6. Enlarged and heterogeneous prostate gland with postsurgical changes of TURP. 7. Partially visualized cluster of nodularity in the right middle lobe. Dedicated chest CT may provide better evaluation on a nonemergent/outpatient basis. 8. Aortic Atherosclerosis (ICD10-I70.0). Electronically Signed   By: AAnner CreteM.D.   On: 03/30/2021 18:00    Procedures Procedures   Medications Ordered in ED Medications  fentaNYL (SUBLIMAZE) injection 50 mcg (50 mcg Intravenous Given 03/30/21 1535)  ondansetron (ZOFRAN) injection 4 mg (  4 mg Intravenous Given 03/30/21 1535)  morphine 4 MG/ML injection 4 mg (2 mg Intravenous Given 03/30/21 1702)  iohexol (OMNIPAQUE) 300 MG/ML solution 100 mL (100 mLs Intravenous Contrast Given 03/30/21 1717)  morphine 2 MG/ML injection 2 mg (2 mg Intravenous Given 03/30/21 1800)    ED Course  I have reviewed the triage vital signs and the nursing notes.  Pertinent labs & imaging results that were available during my care of the patient were reviewed by me and considered in my medical decision making (see chart for details).    MDM Rules/Calculators/A&P                          Patient is a 80 year old male who presents today for evaluation of multiple complaints, primary concern is right upper quadrant abdominal pain. Patient was tenderness in the right upper quadrant.  Here patient is awake and alert and generally well-appearing.  He has had nausea and vomiting today.  EKG with out ischemia.  Flu and COVID test is negative.  Lipase is not elevated.  CMP is unremarkable with the exception of mild hyponatremia with a sodium of 131, mild hypochloremia with a chloride of 94, total protein  is slightly low at 59.  Additionally albumin, AST, ALT, alk phos, and total bilirubin are all within normal limits.  He does not have a white count and is not anemic.  His urine is slightly hazy with 20 ketones, he was recently treated for UTI and this appears to have cleared up.  His lipase is not elevated.  CT abdomen pelvis is obtained, does show multiple incidental findings, all of which were discussed with the patient and written on the discharge papers for him to follow-up with primary care doctor.  Specifically the CT scan did not show evidence of acute process that would be causing his symptoms. I discussed with patient and his wife disposition options. Patient will be brought back tomorrow for abdominal ultrasound specifically of the right upper quadrant to further evaluate his right upper quadrant abdominal pain.    Recommended general PCP follow-up.  Return precautions were discussed with patient who states their understanding.  At the time of discharge patient denied any unaddressed complaints or concerns.  Patient is agreeable for discharge home.  Note: Portions of this report may have been transcribed using voice recognition software. Every effort was made to ensure accuracy; however, inadvertent computerized transcription errors may be present  Final Clinical Impression(s) / ED Diagnoses Final diagnoses:  Bilateral renal cysts  Diverticulosis  Nodule of middle lobe of right lung    Rx / DC Orders ED Discharge Orders          Ordered    US Abdomen Limited RUQ/Gall Gladder        03/30/21 1944    HYDROcodone-acetaminophen (NORCO/VICODIN) 5-325 MG tablet  Every 6 hours PRN        03/30/21 2018             Lorin Glass, PA-C 03/31/21 0016    Margette Fast, MD 03/31/21 1337

## 2021-03-30 NOTE — Assessment & Plan Note (Addendum)
-  started 3 days ago -suspect gallbladder issue -he will go to ED now for rapid imaging and treatment -called ED to give report, no response

## 2021-03-30 NOTE — Progress Notes (Signed)
Acute Office Visit  Subjective:    Patient ID: Dennis Molina, male    DOB: [birth year], 80 y.o.   MRN: [id number]  Chief Complaint  Patient presents with   Shoulder Pain    R shoulder pain ongoing x1 week ago   Abdominal Pain    R upper quad, ongoing x3 days   Emesis    Ongoing since yesterday, has had 3 episodes    Shoulder Pain   Abdominal Pain Associated symptoms include nausea and vomiting.  Emesis  Associated symptoms include abdominal pain.  Patient is in today for abdominal pain. He states he hasn't been able to sleep in the last 3 days.    Past Medical History:  Diagnosis Date   BPH (benign prostatic hyperplasia)    NHL (non-Hodgkin's lymphoma) [facility])     Past Surgical History:  Procedure Laterality Date   HERNIA REPAIR  1980's   PACEMAKER IMPLANT N/A 10/03/2020   Procedure: PACEMAKER IMPLANT - Dual Chamber;  Surgeon: Evans Lance, MD;  Location: [place] [facility];  Service: Cardiovascular;  Laterality: N/A;    Family History  Problem Relation Age of Onset   Diabetes Brother    Heart disease Brother    Alzheimer's disease Mother    Diabetes Father    Stroke Father    Diabetes Maternal Aunt     Social History   Socioeconomic History   Marital status: Married    Spouse name: Not on file   Number of children: Not on file   Years of education: Not on file   Highest education level: Not on file  Occupational History   Occupation: optemetrist  Tobacco Use   Smoking status: Never   Smokeless tobacco: Never  Vaping Use   Vaping Use: Never used  Substance and Sexual Activity   Alcohol use: Never   Drug use: Never   Sexual activity: Not on file  Other Topics Concern   Not on file  Social History Narrative   Moved from [place].   Currently works as an Dietitian.  Married.    Social Determinants of Health   Financial Resource Strain: Not on file  Food Insecurity: Not on file  Transportation Needs: Not on file  Physical Activity: Not on file   Stress: Not on file  Social Connections: Not on file  Intimate Partner Violence: Not on file    Outpatient Medications Prior to Visit  Medication Sig Dispense Refill   acetaminophen (TYLENOL) 325 MG tablet Take 650 mg by mouth every 6 (six) hours as needed.     Cholecalciferol 50 MCG (2000 UT) TABS Take 2,000 Units by mouth daily.      Coenzyme Q10 100 MG capsule Take 100 mg by mouth daily.      hydrochlorothiazide (HYDRODIURIL) 12.5 MG tablet Take 1 tablet (12.5 mg total) by mouth daily. 90 tablet 3   methocarbamol (ROBAXIN-750) 750 MG tablet Take 1 tablet (750 mg total) by mouth 4 (four) times daily. 30 tablet 0   No facility-administered medications prior to visit.    Allergies  Allergen Reactions   Ambien [Zolpidem Tartrate] Other (See Comments)    Delerious, hallucinations   Zolpidem Other (See Comments)    hyper confusion Memory problems upset stomach Memory problems upset stomach    Benadryl [Diphenhydramine] Other (See Comments)    shakes   Lorazepam Other (See Comments)    Confusion Confusion    Sulfamethoxazole-Trimethoprim Dermatitis, Itching and Rash    Review of Systems  Gastrointestinal:  Positive for abdominal pain, nausea and vomiting.       Right shoulder pain      Objective:    Physical Exam Abdominal:     Tenderness: There is abdominal tenderness in the right upper quadrant.  Neurological:     Mental Status: He is alert.  Psychiatric:        Mood and Affect: Mood normal.        Behavior: Behavior normal.    BP (!) 161/83 (BP Location: Right Arm, Patient Position: Sitting, Cuff Size: Large)   Pulse 69   Temp 97.6 F (36.4 C) (Temporal)   Ht 5\' 9"  (1.753 m)   Wt 137 lb (62.1 kg)   SpO2 96%   BMI 20.23 kg/m  Wt Readings from Last 3 Encounters:  03/30/21 137 lb (62.1 kg)  03/22/21 141 lb (64 kg)  03/13/21 141 lb (64 kg)    Health Maintenance Due  Topic Date Due   Hepatitis C Screening  Never done   Zoster Vaccines- Shingrix (1 of  2) Never done    There are no preventive care reminders to display for this patient.   Lab Results  Component Value Date   TSH 2.219 01/03/2021   Lab Results  Component Value Date   WBC 2.7 (L) 01/03/2021   HGB 13.9 01/03/2021   HCT 41.9 01/03/2021   MCV 96.5 01/03/2021   PLT 110 (L) 01/03/2021   Lab Results  Component Value Date   NA 140 01/03/2021   K 4.7 01/03/2021   CO2 28 01/03/2021   GLUCOSE 83 01/03/2021   BUN 40 (H) 01/03/2021   CREATININE 1.09 01/03/2021   BILITOT 0.9 01/03/2021   ALKPHOS 85 01/03/2021   AST 30 01/03/2021   ALT 26 01/03/2021   PROT 6.3 (L) 01/03/2021   ALBUMIN 4.5 01/03/2021   CALCIUM 9.3 01/03/2021   ANIONGAP 9 01/03/2021   Lab Results  Component Value Date   CHOL 148 01/03/2021   Lab Results  Component Value Date   HDL 63 01/03/2021   Lab Results  Component Value Date   LDLCALC 77 01/03/2021   Lab Results  Component Value Date   TRIG 41 01/03/2021   Lab Results  Component Value Date   CHOLHDL 2.3 01/03/2021   Lab Results  Component Value Date   HGBA1C 4.3 (L) 01/03/2021       Assessment & Plan:   Problem List Items Addressed This Visit       Other   RUQ pain    -started 3 days ago -suspect gallbladder issue -he will go to ED now for rapid imaging and treatment -called ED to give report, no response         No orders of the defined types were placed in this encounter.    Noreene Larsson, NP

## 2021-03-31 ENCOUNTER — Telehealth: Payer: Self-pay | Admitting: *Deleted

## 2021-03-31 ENCOUNTER — Ambulatory Visit (HOSPITAL_COMMUNITY)
Admission: RE | Admit: 2021-03-31 | Discharge: 2021-03-31 | Disposition: A | Discharge status: Home / Self Care | Payer: Medicare Other | Source: Ambulatory Visit | Attending: Physician Assistant | Admitting: Physician Assistant

## 2021-03-31 DIAGNOSIS — R109 Unspecified abdominal pain: Secondary | ICD-10-CM | POA: Diagnosis not present

## 2021-03-31 DIAGNOSIS — K824 Cholesterolosis of gallbladder: Secondary | ICD-10-CM | POA: Diagnosis not present

## 2021-03-31 DIAGNOSIS — R1011 Right upper quadrant pain: Secondary | ICD-10-CM | POA: Diagnosis not present

## 2021-03-31 MED FILL — Hydrocodone-Acetaminophen Tab 5-325 MG: ORAL | Qty: 6 | Status: AC

## 2021-03-31 NOTE — Telephone Encounter (Signed)
He is aware they are going to work him in and to go now

## 2021-03-31 NOTE — Telephone Encounter (Signed)
Pt went to ER yesterday they ran all kind of test but did not do [place] on gallbladder. He wants this ordered so he can have this done today Please advise

## 2021-03-31 NOTE — Telephone Encounter (Signed)
He said he called this number and they said he would need to contact his pcp to have [place] authorize this test. Can you do please?

## 2021-03-31 NOTE — ED Provider Notes (Signed)
Patient returns today for scheduled outpatient ultrasound of the right upper quadrant.  Ultrasound imaging shows a small gallbladder polyp without evidence of cholecystitis or cholelithiasis.  Patient informed of results, agreeable to follow-up with PCP.    US Abdomen Limited RUQ/Gall Gladder  Result Date: 03/31/2021 CLINICAL DATA:  Right upper quadrant abdominal pain. EXAM: ULTRASOUND ABDOMEN LIMITED RIGHT UPPER QUADRANT COMPARISON:  March 30, 2021. FINDINGS: Gallbladder: No gallstones or wall thickening visualized. No sonographic Murphy sign noted by sonographer. 6 mm polyp is noted. Common bile duct: Diameter: 6 mm which is within normal limits. Liver: No focal lesion identified. Within normal limits in parenchymal echogenicity. Portal vein is patent on color Doppler imaging with normal direction of blood flow towards the liver. Other: Right renal cysts are noted as described on prior CT scan. IMPRESSION: Small gallbladder polyp is noted. No significant abnormality seen in the right upper quadrant of the abdomen. Electronically Signed   By: Marijo Conception M.D.   On: 03/31/2021 11:54      Kem Parkinson, PA-C 03/31/21 1234    Fredia Sorrow, MD 04/02/21 [phone]

## 2021-03-31 NOTE — Telephone Encounter (Signed)
The ER dr already ordered this and on his patient instructions it said for him to call [phone] to schedule to have this done

## 2021-04-04 ENCOUNTER — Other Ambulatory Visit: Payer: Self-pay

## 2021-04-04 ENCOUNTER — Telehealth: Payer: Self-pay

## 2021-04-04 ENCOUNTER — Ambulatory Visit (INDEPENDENT_AMBULATORY_CARE_PROVIDER_SITE_OTHER): Payer: Medicare Other

## 2021-04-04 DIAGNOSIS — I441 Atrioventricular block, second degree: Secondary | ICD-10-CM | POA: Diagnosis not present

## 2021-04-04 DIAGNOSIS — R1011 Right upper quadrant pain: Secondary | ICD-10-CM

## 2021-04-04 DIAGNOSIS — K824 Cholesterolosis of gallbladder: Secondary | ICD-10-CM

## 2021-04-04 LAB — CUP PACEART REMOTE DEVICE CHECK
Battery Remaining Longevity: 138 mo
Battery Voltage: 3.14 V
Brady Statistic AP VP Percent: 28.11 %
Brady Statistic AP VS Percent: 0 %
Brady Statistic AS VP Percent: 71.86 %
Brady Statistic AS VS Percent: 0.03 %
Brady Statistic RA Percent Paced: 28.11 %
Brady Statistic RV Percent Paced: 99.97 %
Date Time Interrogation Session: 20220704203122
Implantable Lead Implant Date: 20220103
Implantable Lead Implant Date: 20220103
Implantable Lead Location: 753859
Implantable Lead Location: 753860
Implantable Lead Model: 3830
Implantable Lead Model: 5076
Implantable Pulse Generator Implant Date: 20220103
Lead Channel Impedance Value: 342 Ohm
Lead Channel Impedance Value: 418 Ohm
Lead Channel Impedance Value: 589 Ohm
Lead Channel Impedance Value: 608 Ohm
Lead Channel Pacing Threshold Amplitude: 0.625 V
Lead Channel Pacing Threshold Amplitude: 0.75 V
Lead Channel Pacing Threshold Pulse Width: 0.4 ms
Lead Channel Pacing Threshold Pulse Width: 0.4 ms
Lead Channel Sensing Intrinsic Amplitude: 2.875 mV
Lead Channel Sensing Intrinsic Amplitude: 2.875 mV
Lead Channel Sensing Intrinsic Amplitude: 21 mV
Lead Channel Sensing Intrinsic Amplitude: 21 mV
Lead Channel Setting Pacing Amplitude: 2 V
Lead Channel Setting Pacing Amplitude: 2.5 V
Lead Channel Setting Pacing Pulse Width: 0.4 ms
Lead Channel Setting Sensing Sensitivity: 1.2 mV

## 2021-04-04 NOTE — Telephone Encounter (Signed)
Patient return call said thank you for all your help.

## 2021-04-04 NOTE — Telephone Encounter (Signed)
Patient called and the test he had done that [facility] sent him for says needs to be seen by a GI doctor. He is on his way to [place], but will be in [place] for a week. Asked to send in referral to [facility] in [place] their # [phone].  Patient call back # [phone]

## 2021-04-04 NOTE — Telephone Encounter (Signed)
Patient returning your call make sure he will get in this week for the referral while in [place].

## 2021-04-04 NOTE — Telephone Encounter (Signed)
Referral sent. Left message for pt.

## 2021-04-05 DIAGNOSIS — R1011 Right upper quadrant pain: Secondary | ICD-10-CM | POA: Diagnosis not present

## 2021-04-05 DIAGNOSIS — K5900 Constipation, unspecified: Secondary | ICD-10-CM | POA: Diagnosis not present

## 2021-04-05 NOTE — Telephone Encounter (Signed)
Spoke with pt wife, he is still in a lot of pain. Advised her that he may need to go back to the ER rather than waiting on this referral. She is going to call this office to see the time frame they could get him in.

## 2021-04-05 NOTE — Telephone Encounter (Signed)
Yeah, we don't know how long it will take for them to complete the referral. If he is in pain, the ED should evaluate him.

## 2021-04-25 NOTE — Progress Notes (Signed)
Remote pacemaker transmission.   

## 2021-06-19 ENCOUNTER — Telehealth: Payer: Self-pay | Admitting: Internal Medicine

## 2021-06-19 DIAGNOSIS — Z7689 Persons encountering health services in other specified circumstances: Secondary | ICD-10-CM

## 2021-06-19 NOTE — Telephone Encounter (Signed)
Called pt in regards to referral.  He reports that new cardiologist office requires a referral.  I will place order for referral and route to medical records to release information.

## 2021-06-19 NOTE — Telephone Encounter (Signed)
   Pt is requesting to send a referral and records to the new heart doctor he wants to see in [place]. Pt moved to [facility] recently, he gave name of cards Dr. Meredith Staggers and fax# [phone]

## 2021-06-22 ENCOUNTER — Encounter (HOSPITAL_COMMUNITY): Payer: Self-pay | Admitting: Radiology

## 2021-07-04 ENCOUNTER — Ambulatory Visit (INDEPENDENT_AMBULATORY_CARE_PROVIDER_SITE_OTHER): Payer: Medicare Other

## 2021-07-04 DIAGNOSIS — I441 Atrioventricular block, second degree: Secondary | ICD-10-CM | POA: Diagnosis not present

## 2021-07-04 LAB — CUP PACEART REMOTE DEVICE CHECK
Battery Remaining Longevity: 131 mo
Battery Voltage: 3.07 V
Brady Statistic AP VP Percent: 16.77 %
Brady Statistic AP VS Percent: 0 %
Brady Statistic AS VP Percent: 83.16 %
Brady Statistic AS VS Percent: 0.07 %
Brady Statistic RA Percent Paced: 16.8 %
Brady Statistic RV Percent Paced: 99.93 %
Date Time Interrogation Session: 20221003230802
Implantable Lead Implant Date: 20220103
Implantable Lead Implant Date: 20220103
Implantable Lead Location: 753859
Implantable Lead Location: 753860
Implantable Lead Model: 3830
Implantable Lead Model: 5076
Implantable Pulse Generator Implant Date: 20220103
Lead Channel Impedance Value: 285 Ohm
Lead Channel Impedance Value: 380 Ohm
Lead Channel Impedance Value: 456 Ohm
Lead Channel Impedance Value: 532 Ohm
Lead Channel Pacing Threshold Amplitude: 0.5 V
Lead Channel Pacing Threshold Amplitude: 0.625 V
Lead Channel Pacing Threshold Pulse Width: 0.4 ms
Lead Channel Pacing Threshold Pulse Width: 0.4 ms
Lead Channel Sensing Intrinsic Amplitude: 2.625 mV
Lead Channel Sensing Intrinsic Amplitude: 2.625 mV
Lead Channel Sensing Intrinsic Amplitude: 21 mV
Lead Channel Sensing Intrinsic Amplitude: 21 mV
Lead Channel Setting Pacing Amplitude: 2 V
Lead Channel Setting Pacing Amplitude: 2.5 V
Lead Channel Setting Pacing Pulse Width: 0.4 ms
Lead Channel Setting Sensing Sensitivity: 1.2 mV

## 2021-07-12 NOTE — Progress Notes (Signed)
Remote pacemaker transmission.   

## 2021-11-10 IMAGING — DX DG CHEST 2V
2 series · 2 of 2 positions shown · non-contrast
Comparison: October 03, 2020

CLINICAL DATA: Right upper quadrant pain.

EXAM:
CHEST - 2 VIEW

[chest pa]
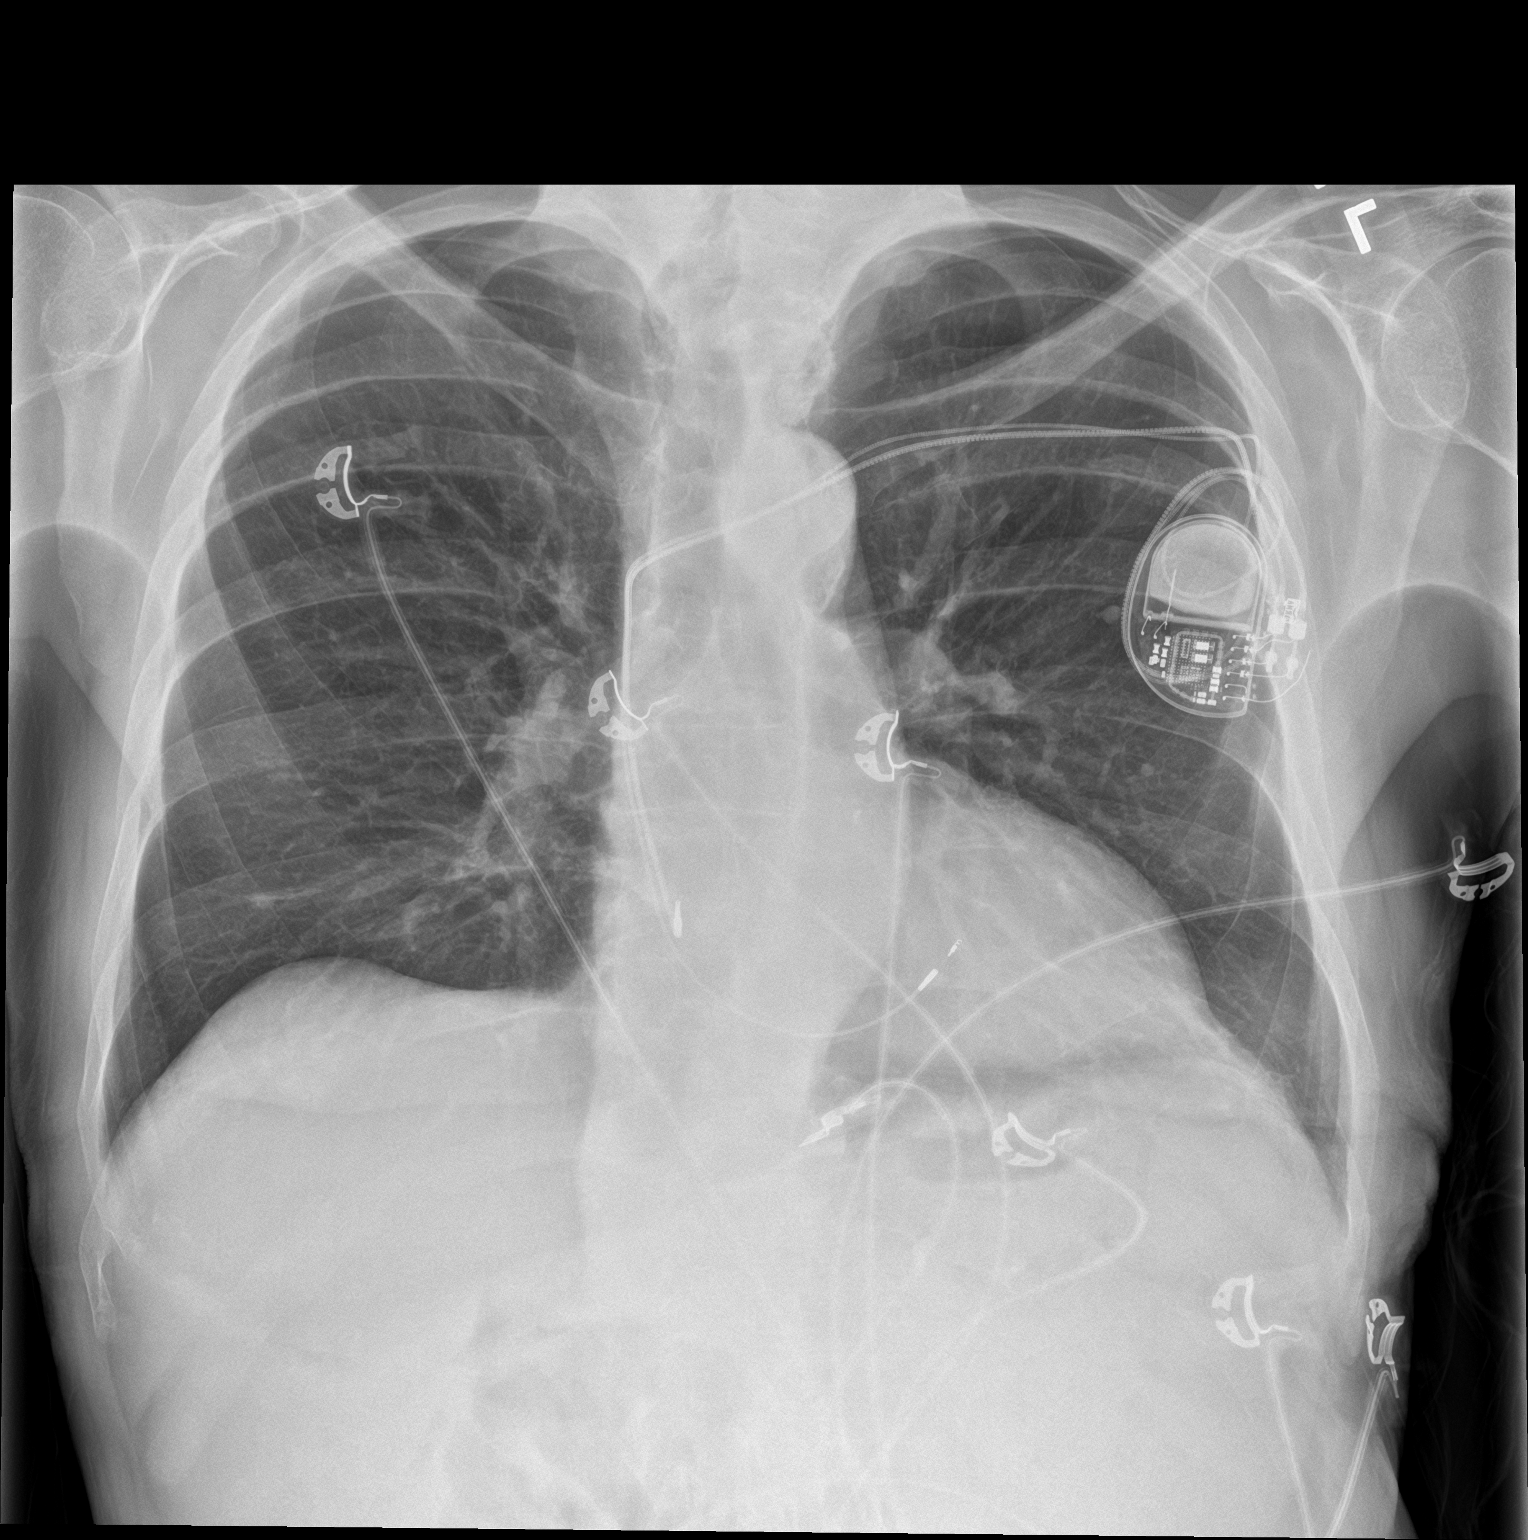

[chest lat]
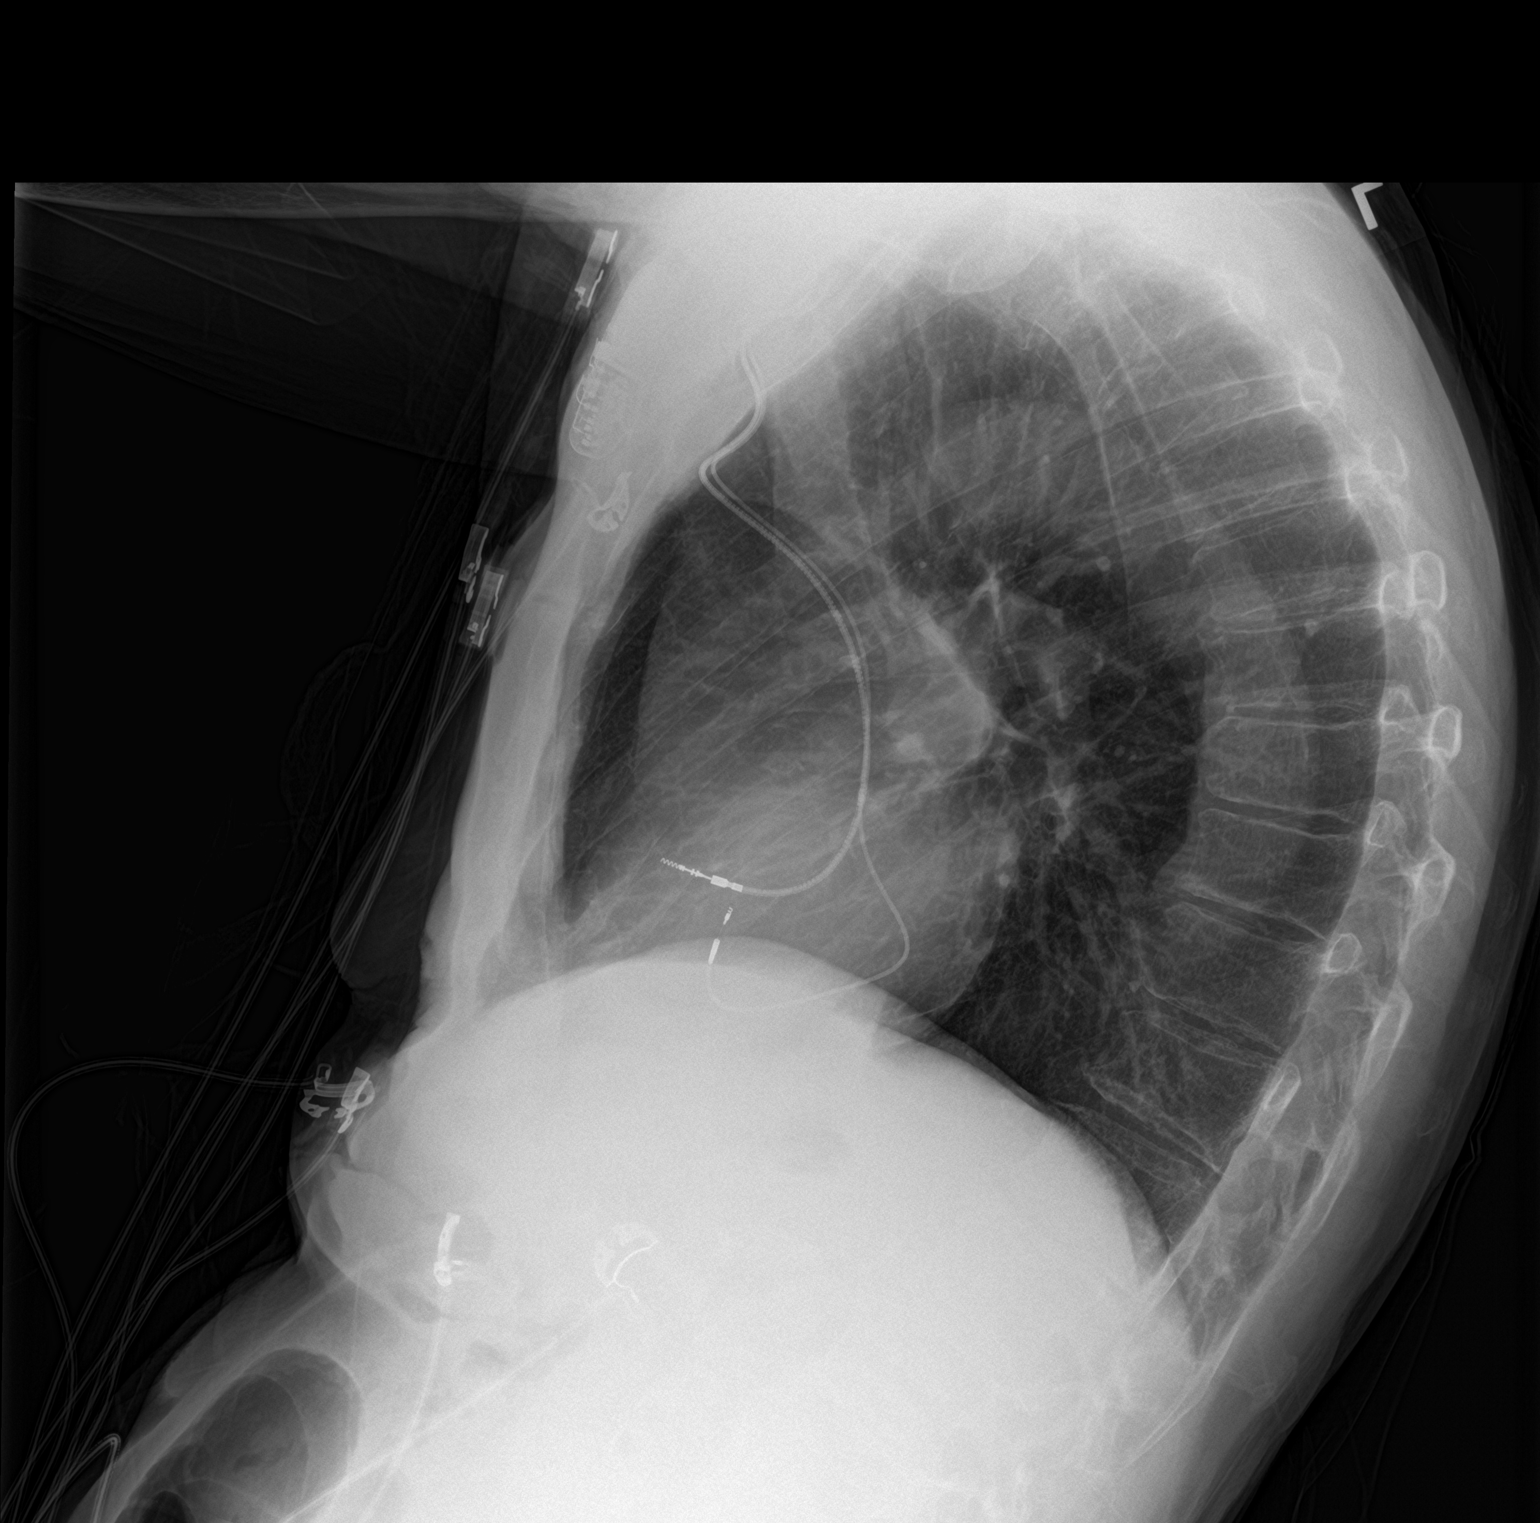

[2 of 2 positions shown; findings below may reference images not displayed]

FINDINGS: A dual lead AICD is in place. Very mild atelectasis is seen within
the left lung base. There is no evidence of a pleural effusion or
pneumothorax. The heart size and mediastinal contours are within
normal limits. The visualized skeletal structures are unremarkable.
IMPRESSION: Very mild left basilar atelectasis.
